# Patient Record
Sex: Male | Born: 1940
Health system: Southern US, Community
[De-identification: ages and names within clinical notes are randomized; demographics above are authoritative.]

## PROBLEM LIST (undated history)

## (undated) DIAGNOSIS — I2510 Atherosclerotic heart disease of native coronary artery without angina pectoris: Secondary | ICD-10-CM

## (undated) DIAGNOSIS — I10 Essential (primary) hypertension: Secondary | ICD-10-CM

## (undated) HISTORY — PX: HERNIA REPAIR: SHX51

## (undated) HISTORY — PX: CARDIAC SURGERY: SHX584

---

## 2011-12-09 ENCOUNTER — Emergency Department: Payer: Self-pay | Admitting: Emergency Medicine

## 2017-02-03 ENCOUNTER — Ambulatory Visit (INDEPENDENT_AMBULATORY_CARE_PROVIDER_SITE_OTHER): Payer: Medicare Other

## 2017-02-03 ENCOUNTER — Ambulatory Visit
Admission: EM | Admit: 2017-02-03 | Discharge: 2017-02-03 | Disposition: A | Discharge status: Home / Self Care | Payer: Medicare Other | Attending: Family Medicine | Admitting: Family Medicine

## 2017-02-03 ENCOUNTER — Encounter: Payer: Self-pay | Admitting: *Deleted

## 2017-02-03 DIAGNOSIS — S51812A Laceration without foreign body of left forearm, initial encounter: Secondary | ICD-10-CM | POA: Diagnosis not present

## 2017-02-03 DIAGNOSIS — S40022A Contusion of left upper arm, initial encounter: Secondary | ICD-10-CM

## 2017-02-03 HISTORY — DX: Essential (primary) hypertension: I10

## 2017-02-03 HISTORY — DX: Atherosclerotic heart disease of native coronary artery without angina pectoris: I25.10

## 2017-02-03 MED ORDER — TETANUS-DIPHTH-ACELL PERTUSSIS 5-2.5-18.5 LF-MCG/0.5 IM SUSP
0.5000 mL | Freq: Once | INTRAMUSCULAR | Status: AC
  Administered 2017-02-03: 0.5 mL via INTRAMUSCULAR

## 2017-02-03 MED ORDER — MUPIROCIN 2 % EX OINT: TOPICAL_OINTMENT | CUTANEOUS | 0 refills | Status: DC

## 2017-02-03 NOTE — Discharge Instructions (Signed)
Use medication as prescribed. Keep clean. Clean daily with soap and water.   Follow up with your primary care physician this week as needed. Return to Urgent care for new or worsening concerns.
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
## 2017-02-11 ENCOUNTER — Ambulatory Visit
Admission: EM | Admit: 2017-02-11 | Discharge: 2017-02-11 | Disposition: A | Discharge status: Home / Self Care | Payer: Medicare Other | Attending: Emergency Medicine | Admitting: Emergency Medicine

## 2017-02-11 DIAGNOSIS — S5012XA Contusion of left forearm, initial encounter: Secondary | ICD-10-CM
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
## 2017-02-11 NOTE — Discharge Instructions (Signed)
Contusion medication as prescribed. Keep clean. Elevate. Warm compresses off and on as discussed.   Follow up with your primary care physician this week as needed. Return to Urgent care for new or worsening concerns.
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
## 2018-06-14 ENCOUNTER — Other Ambulatory Visit: Payer: Self-pay

## 2018-06-14 ENCOUNTER — Encounter: Payer: Self-pay | Admitting: Emergency Medicine

## 2018-06-14 ENCOUNTER — Ambulatory Visit
Admission: EM | Admit: 2018-06-14 | Discharge: 2018-06-14 | Disposition: A | Discharge status: Home / Self Care | Payer: Medicare Other | Attending: Family Medicine | Admitting: Family Medicine

## 2018-06-14 DIAGNOSIS — R42 Dizziness and giddiness: Secondary | ICD-10-CM

## 2018-06-14 DIAGNOSIS — H6983 Other specified disorders of Eustachian tube, bilateral: Secondary | ICD-10-CM | POA: Diagnosis not present

## 2018-06-14 DIAGNOSIS — I10 Essential (primary) hypertension: Secondary | ICD-10-CM | POA: Diagnosis not present

## 2018-06-14 DIAGNOSIS — H9192 Unspecified hearing loss, left ear: Secondary | ICD-10-CM | POA: Diagnosis not present

## 2018-06-14 MED ORDER — CETIRIZINE HCL 10 MG PO TABS: 10.0000 mg | ORAL_TABLET | Freq: Every day | ORAL | 0 refills | Status: DC

## 2018-06-14 MED ORDER — FLUTICASONE PROPIONATE 50 MCG/ACT NA SUSP: 2.0000 | Freq: Every day | NASAL | 0 refills | Status: DC

## 2018-06-14 NOTE — Discharge Instructions (Addendum)
Use the Flonase 2 sprays once daily for the next 2 to 4 weeks.  Use the Zyrtec (cetirizine) 1 each day for 30 days.  If you are not improving follow-up with ear nose and throat.  It was a pleasure taking care of you today.  Thank you for your service

## 2018-06-14 NOTE — ED Provider Notes (Signed)
[facility]    CSN: [id number] Arrival date & time: 06/14/18  1256     History   Chief Complaint Chief Complaint  Patient presents with  . Ear Fullness    HPI Bobby BEAGLEY Sr. is a 77 y.o. male.   HPI  -year-old male presents with fullness in both of his ears.  He states that it is more noticeable on the left.  No pain.  He states that hearing is decreased somewhat.  He also has some occasional dizziness.  Very similar episode years ago that he thinks he was given eardrops and improved.  Not been swimming or been at altitude recently.  Not had any recent colds but has noticed when there is a change in weather he will have allergy type symptoms.  Nuys any fever or chills.  He has had no drainage or tinnitus.           Past Medical History:  Diagnosis Date  . Coronary artery disease   . Hypertension     There are no active problems to display for this patient.   Past Surgical History:  Procedure Laterality Date  . CARDIAC SURGERY         Home Medications    Prior to Admission medications   Medication Sig Start Date End Date Taking? Authorizing Provider  aspirin 81 MG chewable tablet Chew by mouth daily.   Yes [provider]  carvedilol (COREG) 12.5 MG tablet Take 12.5 mg by mouth 2 (two) times daily with a meal.   Yes [provider]  Cholecalciferol (VITAMIN D3 PO) Take by mouth.   Yes [provider]  Cyanocobalamin (VITAMIN B 12 PO) Take by mouth.   Yes [provider]  finasteride (PROSCAR) 5 MG tablet Take 5 mg by mouth daily.   Yes [provider]  latanoprost (XALATAN) 0.005 % ophthalmic solution Place 1 drop into both eyes at bedtime.   Yes [provider]  mupirocin ointment (BACTROBAN) 2 % Apply three times a day for 5 days. 02/03/17  Yes Renford Dills, NP  cetirizine (ZYRTEC ALLERGY) 10 MG tablet Take 1 tablet (10 mg total) by mouth daily. 06/14/18   Lutricia Feil, PA-C    fluticasone (FLONASE) 50 MCG/ACT nasal spray Place 2 sprays into both nostrils daily. 06/14/18   Lutricia Feil, PA-C    Family History History reviewed. No pertinent family history.  Social History Social History   Tobacco Use  . Smoking status: Former Games developer  . Smokeless tobacco: Never Used  Substance Use Topics  . Alcohol use: No  . Drug use: No     Allergies   Patient has no known allergies.   Review of Systems Review of Systems  Constitutional: Negative for activity change, appetite change, chills, fatigue and fever.  HENT: Positive for hearing loss. Negative for ear discharge and ear pain.      Physical Exam Triage Vital Signs ED Triage Vitals  Enc Vitals Group     BP 06/14/18 1315 (!) 164/73     Pulse Rate 06/14/18 1315 63     Resp 06/14/18 1315 18     Temp 06/14/18 1315 97.8 F (36.6 C)     Temp Source 06/14/18 1315 Oral     SpO2 06/14/18 1315 100 %     Weight 06/14/18 1317 170 lb (77.1 kg)     Height 06/14/18 1317 5\' 8"  (1.727 m)     Head Circumference --  Peak Flow --      Pain Score 06/14/18 1315 0     Pain Loc --      Pain Edu? --      Excl. in GC? --    No data found.  Updated Vital Signs BP (!) 164/73 (BP Location: Left Arm)   Pulse 63   Temp 97.8 F (36.6 C) (Oral)   Resp 18   Ht 5\' 8"  (1.727 m)   Wt 170 lb (77.1 kg)   SpO2 100%   BMI 25.85 kg/m   Visual Acuity Right Eye Distance:   Left Eye Distance:   Bilateral Distance:    Right Eye Near:   Left Eye Near:    Bilateral Near:     Physical Exam  Constitutional: He is oriented to person, place, and time. He appears well-developed and well-nourished. No distress.  HENT:  Head: Normocephalic.  Right Ear: External ear normal.  Left Ear: External ear normal.  Nose: Nose normal.  Mouth/Throat: Oropharynx is clear and moist. No oropharyngeal exudate.  Examination left ear shows a normal light reflex.  There is mild sclerosis of the posterior aspect of the TM.  The right  TM does more sclerosis loss of the light reflex no erythema.No  Air-fluid level is appreciated.  Eyes: Pupils are equal, round, and reactive to light. Right eye exhibits no discharge. Left eye exhibits no discharge.  Neck: Normal range of motion.  Pulmonary/Chest: Effort normal and breath sounds normal.  Musculoskeletal: Normal range of motion.  Neurological: He is alert and oriented to person, place, and time.  Skin: Skin is warm and dry. He is not diaphoretic.  Psychiatric: He has a normal mood and affect. His behavior is normal. Judgment and thought content normal.  Nursing note and vitals reviewed.    UC Treatments / Results  Labs (all labs ordered are listed, but only abnormal results are displayed) Labs Reviewed - No data to display  EKG None  Radiology No results found.  Procedures Procedures (including critical care time)  Medications Ordered in UC Medications - No data to display  Initial Impression / Assessment and Plan / UC Course  I have reviewed the triage vital signs and the nursing notes.  Pertinent labs & imaging results that were available during my care of the patient were reviewed by me and considered in my medical decision making (see chart for details).    Told patient that it is likely that he has eustachian tube dysfunction accounting for his ear fullness.  May also be responsible for contributing to his dizziness although dehydration may figure as a part.  Start him on Flonase daily for 3 to 4 weeks.  In addition I have asked him to start taking Zyrtec on a daily basis.  Provided him the name of an ear nose and throat specialist that if he is not improving he would follow-up with them. Final Clinical Impressions(s) / UC Diagnoses   Final diagnoses:  Eustachian tube dysfunction, bilateral     Discharge Instructions     Use the Flonase 2 sprays once daily for the next 2 to 4 weeks.  Use the Zyrtec (cetirizine) 1 each day for 30 days.  If you are not  improving follow-up with ear nose and throat.  It was a pleasure taking care of you today.  Thank you for your service   ED Prescriptions    Medication Sig Dispense Auth. Provider   fluticasone (FLONASE) 50 MCG/ACT nasal spray Place 2 sprays  into both nostrils daily. 16 g Ovid Curd P, PA-C   cetirizine (ZYRTEC ALLERGY) 10 MG tablet Take 1 tablet (10 mg total) by mouth daily. 30 tablet Lutricia Feil, PA-C     Controlled Substance Prescriptions [place] Controlled Substance Registry consulted? Not Applicable   Lutricia Feil, PA-C 06/14/18 1556

## 2018-06-14 NOTE — ED Triage Notes (Signed)
Patient c/o ear fullness in both ears but states the left ear is worse. Patient denies pain.

## 2019-09-06 ENCOUNTER — Ambulatory Visit: Payer: Medicare Other | Attending: Internal Medicine

## 2019-09-06 DIAGNOSIS — Z20822 Contact with and (suspected) exposure to covid-19: Secondary | ICD-10-CM

## 2019-09-07 ENCOUNTER — Telehealth: Payer: Self-pay

## 2019-09-07 LAB — NOVEL CORONAVIRUS, NAA: SARS-CoV-2, NAA: NOT DETECTED

## 2019-09-07 NOTE — Telephone Encounter (Signed)
Patient called in requesting COVID19 lab results  - DOB/Address verified - Negative results given, no further questions. 

## 2019-09-16 ENCOUNTER — Other Ambulatory Visit: Payer: Self-pay

## 2019-09-16 DIAGNOSIS — R0902 Hypoxemia: Secondary | ICD-10-CM | POA: Diagnosis not present

## 2019-09-16 DIAGNOSIS — I160 Hypertensive urgency: Secondary | ICD-10-CM | POA: Diagnosis present

## 2019-09-16 DIAGNOSIS — Z79899 Other long term (current) drug therapy: Secondary | ICD-10-CM

## 2019-09-16 DIAGNOSIS — Z7982 Long term (current) use of aspirin: Secondary | ICD-10-CM

## 2019-09-16 DIAGNOSIS — Z87891 Personal history of nicotine dependence: Secondary | ICD-10-CM

## 2019-09-16 DIAGNOSIS — J9601 Acute respiratory failure with hypoxia: Secondary | ICD-10-CM | POA: Diagnosis present

## 2019-09-16 DIAGNOSIS — U071 COVID-19: Principal | ICD-10-CM | POA: Diagnosis present

## 2019-09-16 DIAGNOSIS — I10 Essential (primary) hypertension: Secondary | ICD-10-CM | POA: Diagnosis present

## 2019-09-16 DIAGNOSIS — I2510 Atherosclerotic heart disease of native coronary artery without angina pectoris: Secondary | ICD-10-CM | POA: Diagnosis present

## 2019-09-16 DIAGNOSIS — J1282 Pneumonia due to coronavirus disease 2019: Secondary | ICD-10-CM | POA: Diagnosis present

## 2019-09-16 DIAGNOSIS — E871 Hypo-osmolality and hyponatremia: Secondary | ICD-10-CM | POA: Diagnosis present

## 2019-09-16 LAB — LIPASE, BLOOD: Lipase: 60 U/L — ABNORMAL HIGH (ref 11–51)

## 2019-09-16 LAB — COMPREHENSIVE METABOLIC PANEL
ALT: 22 U/L (ref 0–44)
AST: 40 U/L (ref 15–41)
Albumin: 3.7 g/dL (ref 3.5–5.0)
Alkaline Phosphatase: 67 U/L (ref 38–126)
Anion gap: 12 (ref 5–15)
BUN: 18 mg/dL (ref 8–23)
CO2: 23 mmol/L (ref 22–32)
Calcium: 8.4 mg/dL — ABNORMAL LOW (ref 8.9–10.3)
Chloride: 89 mmol/L — ABNORMAL LOW (ref 98–111)
Creatinine, Ser: 1.02 mg/dL (ref 0.61–1.24)
GFR calc Af Amer: >60 mL/min (ref >60)
GFR calc non Af Amer: >60 mL/min (ref >60)
Glucose, Bld: 107 mg/dL — ABNORMAL HIGH (ref 70–99)
Potassium: 3.9 mmol/L (ref 3.5–5.1)
Sodium: 124 mmol/L — ABNORMAL LOW (ref 135–145)
Total Bilirubin: 1 mg/dL (ref 0.3–1.2)
Total Protein: 7.6 g/dL (ref 6.5–8.1)

## 2019-09-16 LAB — CBC WITH DIFFERENTIAL/PLATELET
Abs Immature Granulocytes: 0.03 10*3/uL (ref 0.00–0.07)
Basophils Absolute: 0 10*3/uL (ref 0.0–0.1)
Basophils Relative: 0 %
Eosinophils Absolute: 0 10*3/uL (ref 0.0–0.5)
Eosinophils Relative: 0 %
HCT: 39.6 % (ref 39.0–52.0)
Hemoglobin: 14.1 g/dL (ref 13.0–17.0)
Immature Granulocytes: 1 %
Lymphocytes Relative: 11 %
Lymphs Abs: 0.7 10*3/uL (ref 0.7–4.0)
MCH: 31.5 pg (ref 26.0–34.0)
MCHC: 35.6 g/dL (ref 30.0–36.0)
MCV: 88.4 fL (ref 80.0–100.0)
Monocytes Absolute: 0.3 10*3/uL (ref 0.1–1.0)
Monocytes Relative: 5 %
Neutro Abs: 5.2 10*3/uL (ref 1.7–7.7)
Neutrophils Relative %: 83 %
Platelets: 217 10*3/uL (ref 150–400)
RBC: 4.48 MIL/uL (ref 4.22–5.81)
RDW: 13 % (ref 11.5–15.5)
WBC: 6.2 10*3/uL (ref 4.0–10.5)
nRBC: 0 % (ref 0.0–0.2)

## 2019-09-16 NOTE — ED Notes (Signed)
Patient to triage via wheelchair by EMS.  EMS reports nausea for 1 week.  Wife is COVID + and recently released from hospital. EMS vitals  BP 190/100,  HR 78, pulse oxi 97% on roomair, temp 97.8  (oral), 12 lead within normal limits.

## 2019-09-16 NOTE — ED Triage Notes (Signed)
Patient to ED via EMS for nausea x 1 week.  Patient reports wife recently released from hospital related to COVID.

## 2019-09-16 NOTE — ED Notes (Signed)
Please call daughter Nicoletta Ba with updates [phone].

## 2019-09-17 ENCOUNTER — Inpatient Hospital Stay
Admission: EM | Admit: 2019-09-17 | Discharge: 2019-09-21 | DRG: 177 | Disposition: A | Discharge status: Home Health | Payer: Medicare Other | Attending: Internal Medicine | Admitting: Internal Medicine

## 2019-09-17 ENCOUNTER — Emergency Department: Payer: Medicare Other

## 2019-09-17 ENCOUNTER — Encounter: Payer: Self-pay | Admitting: Internal Medicine

## 2019-09-17 DIAGNOSIS — I10 Essential (primary) hypertension: Secondary | ICD-10-CM | POA: Diagnosis present

## 2019-09-17 DIAGNOSIS — U071 COVID-19: Principal | ICD-10-CM

## 2019-09-17 DIAGNOSIS — I2510 Atherosclerotic heart disease of native coronary artery without angina pectoris: Secondary | ICD-10-CM | POA: Diagnosis present

## 2019-09-17 DIAGNOSIS — Z7982 Long term (current) use of aspirin: Secondary | ICD-10-CM | POA: Diagnosis not present

## 2019-09-17 DIAGNOSIS — E871 Hypo-osmolality and hyponatremia: Secondary | ICD-10-CM | POA: Diagnosis present

## 2019-09-17 DIAGNOSIS — J9601 Acute respiratory failure with hypoxia: Secondary | ICD-10-CM | POA: Diagnosis present

## 2019-09-17 DIAGNOSIS — Z87891 Personal history of nicotine dependence: Secondary | ICD-10-CM | POA: Diagnosis not present

## 2019-09-17 DIAGNOSIS — J1282 Pneumonia due to coronavirus disease 2019: Secondary | ICD-10-CM

## 2019-09-17 DIAGNOSIS — R0902 Hypoxemia: Secondary | ICD-10-CM | POA: Diagnosis present

## 2019-09-17 DIAGNOSIS — Z79899 Other long term (current) drug therapy: Secondary | ICD-10-CM | POA: Diagnosis not present

## 2019-09-17 DIAGNOSIS — I160 Hypertensive urgency: Secondary | ICD-10-CM | POA: Diagnosis present

## 2019-09-17 LAB — RESPIRATORY PANEL BY RT PCR (FLU A&B, COVID)
Influenza A by PCR: NEGATIVE
Influenza B by PCR: NEGATIVE
SARS Coronavirus 2 by RT PCR: POSITIVE — AB

## 2019-09-17 LAB — POC SARS CORONAVIRUS 2 AG: SARS Coronavirus 2 Ag: POSITIVE — AB

## 2019-09-17 MED ORDER — ONDANSETRON HCL 4 MG PO TABS: 4.0000 mg | ORAL_TABLET | Freq: Four times a day (QID) | ORAL | Status: DC | PRN

## 2019-09-17 MED ORDER — VITAMIN D 25 MCG (1000 UNIT) PO TABS
1000.0000 [IU] | ORAL_TABLET | Freq: Every day | ORAL | Status: DC
  Administered 2019-09-17 – 2019-09-21 (×5): 1000 [IU] via ORAL
  Filled 2019-09-17 (×5): qty 1

## 2019-09-17 MED ORDER — ONDANSETRON HCL 4 MG/2ML IJ SOLN
4.0000 mg | Freq: Four times a day (QID) | INTRAMUSCULAR | Status: DC | PRN
  Administered 2019-09-17: 4 mg via INTRAVENOUS
  Filled 2019-09-17: qty 2

## 2019-09-17 MED ORDER — ONDANSETRON HCL 4 MG/2ML IJ SOLN
INTRAMUSCULAR | Status: AC
  Filled 2019-09-17: qty 2

## 2019-09-17 MED ORDER — LATANOPROST 0.005 % OP SOLN
1.0000 [drp] | Freq: Every day | OPHTHALMIC | Status: DC
  Administered 2019-09-17 – 2019-09-20 (×4): 1 [drp] via OPHTHALMIC
  Filled 2019-09-17 (×2): qty 2.5

## 2019-09-17 MED ORDER — DEXAMETHASONE SODIUM PHOSPHATE 10 MG/ML IJ SOLN
10.0000 mg | Freq: Once | INTRAMUSCULAR | Status: AC
  Administered 2019-09-17: 05:00:00 10 mg via INTRAVENOUS
  Filled 2019-09-17: qty 1

## 2019-09-17 MED ORDER — CARVEDILOL 3.125 MG PO TABS
6.2500 mg | ORAL_TABLET | Freq: Two times a day (BID) | ORAL | Status: DC
  Administered 2019-09-17 – 2019-09-21 (×9): 6.25 mg via ORAL
  Filled 2019-09-17 (×6): qty 2
  Filled 2019-09-17: qty 1
  Filled 2019-09-17: qty 2
  Filled 2019-09-17: qty 1

## 2019-09-17 MED ORDER — SODIUM CHLORIDE 0.9 % IV SOLN
100.0000 mg | Freq: Every day | INTRAVENOUS | Status: AC
  Administered 2019-09-18 – 2019-09-21 (×4): 100 mg via INTRAVENOUS
  Filled 2019-09-17 (×5): qty 20

## 2019-09-17 MED ORDER — POLYETHYLENE GLYCOL 3350 17 G PO PACK: 17.0000 g | PACK | Freq: Every day | ORAL | Status: DC | PRN

## 2019-09-17 MED ORDER — SODIUM CHLORIDE 0.9 % IV BOLUS
1000.0000 mL | Freq: Once | INTRAVENOUS | Status: AC
  Administered 2019-09-17: 04:00:00 1000 mL via INTRAVENOUS

## 2019-09-17 MED ORDER — ENOXAPARIN SODIUM 40 MG/0.4ML ~~LOC~~ SOLN
40.0000 mg | SUBCUTANEOUS | Status: DC
  Administered 2019-09-17 – 2019-09-20 (×4): 40 mg via SUBCUTANEOUS
  Filled 2019-09-17 (×4): qty 0.4

## 2019-09-17 MED ORDER — SODIUM CHLORIDE 0.9 % IV SOLN
200.0000 mg | Freq: Once | INTRAVENOUS | Status: AC
  Administered 2019-09-17: 200 mg via INTRAVENOUS
  Filled 2019-09-17: qty 200

## 2019-09-17 MED ORDER — DEXAMETHASONE SODIUM PHOSPHATE 10 MG/ML IJ SOLN
6.0000 mg | INTRAMUSCULAR | Status: DC
  Administered 2019-09-18 – 2019-09-21 (×4): 6 mg via INTRAVENOUS
  Filled 2019-09-17 (×4): qty 1

## 2019-09-17 MED ORDER — VITAMIN B-12 1000 MCG PO TABS
1000.0000 ug | ORAL_TABLET | Freq: Every day | ORAL | Status: DC
  Administered 2019-09-17 – 2019-09-21 (×5): 1000 ug via ORAL
  Filled 2019-09-17 (×5): qty 1

## 2019-09-17 MED ORDER — SODIUM CHLORIDE 0.9 % IV SOLN: INTRAVENOUS | Status: DC

## 2019-09-17 MED ORDER — ASPIRIN 81 MG PO CHEW
81.0000 mg | CHEWABLE_TABLET | Freq: Every day | ORAL | Status: DC
  Administered 2019-09-17 – 2019-09-21 (×5): 81 mg via ORAL
  Filled 2019-09-17 (×5): qty 1

## 2019-09-17 MED ORDER — ACETAMINOPHEN 325 MG PO TABS
650.0000 mg | ORAL_TABLET | Freq: Four times a day (QID) | ORAL | Status: DC | PRN
  Administered 2019-09-18 (×2): 650 mg via ORAL
  Filled 2019-09-17 (×2): qty 2

## 2019-09-17 NOTE — ED Notes (Signed)
Patient notified that his daughter had called to check on him. Patient also given an update on wait time. Patient verbalizes understanding.

## 2019-09-17 NOTE — ED Notes (Signed)
Patient to stat desk in no acute distress asking about wait time. Patient given update on wait time. Patient verbalizes understanding.  

## 2019-09-17 NOTE — ED Notes (Signed)
Pt ambulatory to toilet

## 2019-09-17 NOTE — ED Provider Notes (Signed)
[facility] Emergency Department Provider Note  ____________________________________________   First MD Initiated Contact with Patient 09/17/19 [phone]     (approximate)  I have reviewed the triage vital signs and the nursing notes.   HISTORY  Chief Complaint Nausea   HPI Bobby TANZI Sr. is a 79 y.o. male   presents to the emergency department secondary to nausea and diarrhea x1 week.  Patient also admits to subjective fevers oral temperature on arrival 100.2.  Patient also admits to cough and dyspnea.  Of note patient's wife was recently discharged from the hospital secondary to COVID-19.  Patient admits to generalized weakness and fatigue.     Past Medical History:  Diagnosis Date  . Coronary artery disease   . Hypertension     There are no problems to display for this patient.   Past Surgical History:  Procedure Laterality Date  . CARDIAC SURGERY      Prior to Admission medications   Medication Sig Start Date End Date Taking? Authorizing Provider  aspirin 81 MG chewable tablet Chew 81 mg by mouth daily.     [provider]  carvedilol (COREG) 6.25 MG tablet Take 6.25 mg by mouth 2 (two) times daily with a meal.     [provider]  Cholecalciferol (VITAMIN D3 PO) Take 1 tablet by mouth daily.     [provider]  latanoprost (XALATAN) 0.005 % ophthalmic solution Place 1 drop into both eyes at bedtime.    [provider]  predniSONE (DELTASONE) 20 MG tablet Take 40 mg by mouth daily. 09/11/19 09/18/19  [provider]  vitamin B-12 (CYANOCOBALAMIN) 1000 MCG tablet Take 1,000 mcg by mouth daily.    [provider]    Allergies Patient has no known allergies.  No family history on file.  Social History Social History   Tobacco Use  . Smoking status: Former Games developer  . Smokeless tobacco: Never Used  Substance Use Topics  . Alcohol use: No  . Drug use: No    Review of  Systems Constitutional: Positive for fever/chills Eyes: No visual changes. ENT: No sore throat. Cardiovascular: Denies chest pain. Respiratory: Positive for cough and dyspnea Gastrointestinal: No abdominal pain.  No nausea, no vomiting.  Positive for diarrhea.  No constipation. Genitourinary: Negative for dysuria. Musculoskeletal: Negative for neck pain.  Negative for back pain. Integumentary: Negative for rash. Neurological: Negative for headaches, focal weakness or numbness.   ____________________________________________   PHYSICAL EXAM:  VITAL SIGNS: ED Triage Vitals  Enc Vitals Group     BP 09/16/19 2210 (!) 223/84     Pulse Rate 09/16/19 2210 77     Resp 09/16/19 2210 18     Temp 09/16/19 2210 100.2 F (37.9 C)     Temp Source 09/16/19 2210 Oral     SpO2 09/16/19 2210 95 %     Weight 09/16/19 2211 77.1 kg (170 lb)     Height 09/16/19 2211 1.727 m (5\' 8" )     Head Circumference --      Peak Flow --      Pain Score 09/16/19 2211 0     Pain Loc --      Pain Edu? --      Excl. in GC? --     Constitutional: Alert and oriented.  Apparent dyspnea Eyes: Conjunctivae are normal.  Mouth/Throat: Patient is wearing a mask. Neck: No stridor.  No meningeal signs.   Cardiovascular: Normal rate, regular rhythm. Good peripheral  circulation. Grossly normal heart sounds. Respiratory: Tachypnea, diffuse rhonchi on auscultation.. Gastrointestinal: Soft and nontender. No distention.  Musculoskeletal: No lower extremity tenderness nor edema. No gross deformities of extremities. Neurologic:  Normal speech and language. No gross focal neurologic deficits are appreciated.  Skin:  Skin is warm, dry and intact. Psychiatric: Mood and affect are normal. Speech and behavior are normal.  ____________________________________________   LABS (all labs ordered are listed, but only abnormal results are displayed)  Labs Reviewed  RESPIRATORY PANEL BY RT PCR (FLU A&B, COVID) - Abnormal; Notable  for the following components:      Result Value   SARS Coronavirus 2 by RT PCR POSITIVE (*)    All other components within normal limits  COMPREHENSIVE METABOLIC PANEL - Abnormal; Notable for the following components:   Sodium 124 (*)    Chloride 89 (*)    Glucose, Bld 107 (*)    Calcium 8.4 (*)    All other components within normal limits  LIPASE, BLOOD - Abnormal; Notable for the following components:   Lipase 60 (*)    All other components within normal limits  POC SARS CORONAVIRUS 2 AG - Abnormal; Notable for the following components:   SARS Coronavirus 2 Ag POSITIVE (*)    All other components within normal limits  CBC WITH DIFFERENTIAL/PLATELET  POC SARS CORONAVIRUS 2 AG -  ED   ____  RADIOLOGY I, Cecil N Kenesha Moshier, personally viewed and evaluated these images (plain radiographs) as part of my medical decision making, as well as reviewing the written report by the radiologist.  ED MD interpretation: Bilateral opacities noted on chest x-ray  Official radiology report(s): DG Chest Portable 1 View  Result Date: 09/17/2019 CLINICAL DATA:  Cough EXAM: PORTABLE CHEST 1 VIEW COMPARISON:  None. FINDINGS: Mild cardiomegaly with calcific aortic atherosclerosis. Remote median sternotomy. Bilateral basilar predominant head air central opacities. No pleural effusion or pneumothorax. IMPRESSION: Bilateral basilar predominant air central opacities, which may indicate pulmonary edema or infection. Electronically Signed   By: Ulyses Jarred M.D.   On: 09/17/2019 04:59    ____________________________________________   PROCEDURES     .Critical Care Performed by: Gregor Hams, MD Authorized by: Gregor Hams, MD   Critical care provider statement:    Critical care time (minutes):  30   Critical care time was exclusive of:  Separately billable procedures and treating other patients   Critical care was necessary to treat or prevent imminent or life-threatening deterioration of  the following conditions:  Respiratory failure and endocrine crisis   Critical care was time spent personally by me on the following activities:  Development of treatment plan with patient or surrogate, discussions with consultants, evaluation of patient's response to treatment, examination of patient, obtaining history from patient or surrogate, ordering and performing treatments and interventions, ordering and review of laboratory studies, ordering and review of radiographic studies, pulse oximetry, re-evaluation of patient's condition and review of old charts     ____________________________________________   INITIAL IMPRESSION / MDM / [place] / ED COURSE  As part of my medical decision making, I reviewed the following data within the electronic MEDICAL RECORD NUMBER   79 year old male presenting with above-stated history and physical exam concerning for COVID-19 infection which was confirmed in the emergency department.  Patient also noted to be hyponatremic and as such 2 L IV normal saline administered.  Patient's oxygen saturation on room air 85% during my evaluation.  As such 3 L oxygen via nasal  cannula was administered.  Patient discussed with Dr. Para March for hospital admission for further evaluation and management.  ____________________________________________  FINAL CLINICAL IMPRESSION(S) / ED DIAGNOSES  Final diagnoses:  Hyponatremia  COVID-19 virus infection  Pneumonia due to COVID-19 virus     MEDICATIONS GIVEN DURING THIS VISIT:  Medications  remdesivir 200 mg in sodium chloride 0.9% 250 mL IVPB (200 mg Intravenous New Bag/Given 09/17/19 0443)    Followed by  remdesivir 100 mg in sodium chloride 0.9 % 100 mL IVPB (has no administration in time range)  sodium chloride 0.9 % bolus 1,000 mL (1,000 mLs Intravenous New Bag/Given 09/17/19 0346)  ondansetron (ZOFRAN) 4 MG/2ML injection (  Given 09/17/19 0420)  dexamethasone (DECADRON) injection 10 mg (10 mg Intravenous  Given 09/17/19 0442)     ED Discharge Orders    None      *Please note:  Bobby BUSSEY Sr. was evaluated in Emergency Department on 09/17/2019 for the symptoms described in the history of present illness. He was evaluated in the context of the global COVID-19 pandemic, which necessitated consideration that the patient might be at risk for infection with the SARS-CoV-2 virus that causes COVID-19. Institutional protocols and algorithms that pertain to the evaluation of patients at risk for COVID-19 are in a state of rapid change based on information released by regulatory bodies including the CDC and federal and state organizations. These policies and algorithms were followed during the patient's care in the ED.  Some ED evaluations and interventions may be delayed as a result of limited staffing during the pandemic.*  Note:  This document was prepared using Dragon voice recognition software and may include unintentional dictation errors.   Darci Current, MD 09/17/19 [phone]

## 2019-09-17 NOTE — ED Notes (Signed)
Lunch tray provided to pt. Pt sitting up at this time.

## 2019-09-17 NOTE — ED Notes (Signed)
Pt sats 88%, placedx on 3L at this time

## 2019-09-17 NOTE — ED Notes (Signed)
Pt ambulatory to toilet to urinate.  

## 2019-09-17 NOTE — ED Notes (Addendum)
ED TO INPATIENT HANDOFF REPORT  ED Nurse Name and Phone #:  Elijah Birk RN   [phone]  S Name/Age/Gender Bobby Church Sr. 79 y.o. male Room/Bed: ED10A/ED10A  Code Status   Code Status: Full Code  Home/SNF/Other Home Patient oriented to: self, place, time and situation Is this baseline? Yes   Triage Complete: Triage complete  Chief Complaint Pneumonia due to COVID-19 virus [U07.1, J12.82]  Triage Note Patient to ED via EMS for nausea x 1 week.  Patient reports wife recently released from hospital related to COVID.    Allergies No Known Allergies  Level of Care/Admitting Diagnosis ED Disposition    ED Disposition Condition Comment   Admit  Hospital Area: [facility] [[id number]]  Level of Care: Med-Surg [16]  Covid Evaluation: Confirmed COVID Positive  Diagnosis: Pneumonia due to COVID-19 virus [[id number]]  Admitting Physician: Conard Novak  Attending Physician: Conard Novak  Estimated length of stay: 3 - 4 days  Certification:: I certify this patient will need inpatient services for at least 2 midnights       B Medical/Surgery History Past Medical History:  Diagnosis Date  . Coronary artery disease   . Hypertension    Past Surgical History:  Procedure Laterality Date  . CARDIAC SURGERY       A IV Location/Drains/Wounds Patient Lines/Drains/Airways Status   Active Line/Drains/Airways    Name:   Placement date:   Placement time:   Site:   Days:   Peripheral IV 09/17/19 Left Antecubital   09/17/19    0336    Antecubital   less than 1          Intake/Output Last 24 hours  Intake/Output Summary (Last 24 hours) at 09/17/2019 2027 Last data filed at 09/17/2019 0754 Gross per 24 hour  Intake 1250 ml  Output --  Net 1250 ml    Labs/Imaging Results for orders placed or performed during the hospital encounter of 09/17/19 (from the past 48 hour(s))  CBC with Differential     Status: None   Collection Time:  09/16/19 10:19 PM  Result Value Ref Range   WBC 6.2 4.0 - 10.5 K/uL   RBC 4.48 4.22 - 5.81 MIL/uL   Hemoglobin 14.1 13.0 - 17.0 g/dL   HCT 40.0 86.7 - 61.9 %   MCV 88.4 80.0 - 100.0 fL   MCH 31.5 26.0 - 34.0 pg   MCHC 35.6 30.0 - 36.0 g/dL   RDW 50.9 32.6 - 71.2 %   Platelets 217 150 - 400 K/uL   nRBC 0.0 0.0 - 0.2 %   Neutrophils Relative % 83 %   Neutro Abs 5.2 1.7 - 7.7 K/uL   Lymphocytes Relative 11 %   Lymphs Abs 0.7 0.7 - 4.0 K/uL   Monocytes Relative 5 %   Monocytes Absolute 0.3 0.1 - 1.0 K/uL   Eosinophils Relative 0 %   Eosinophils Absolute 0.0 0.0 - 0.5 K/uL   Basophils Relative 0 %   Basophils Absolute 0.0 0.0 - 0.1 K/uL   Immature Granulocytes 1 %   Abs Immature Granulocytes 0.03 0.00 - 0.07 K/uL    Comment: Performed at [facility], [address] [place], [place] [address]  Comprehensive metabolic panel     Status: Abnormal   Collection Time: 09/16/19 10:19 PM  Result Value Ref Range   Sodium 124 (L) 135 - 145 mmol/L   Potassium 3.9 3.5 - 5.1 mmol/L   Chloride 89 (L) 98 - 111 mmol/L  CO2 23 22 - 32 mmol/L   Glucose, Bld 107 (H) 70 - 99 mg/dL   BUN 18 8 - 23 mg/dL   Creatinine, Ser 1.02 0.61 - 1.24 mg/dL   Calcium 8.4 (L) 8.9 - 10.3 mg/dL   Total Protein 7.6 6.5 - 8.1 g/dL   Albumin 3.7 3.5 - 5.0 g/dL   AST 40 15 - 41 U/L   ALT 22 0 - 44 U/L   Alkaline Phosphatase 67 38 - 126 U/L   Total Bilirubin 1.0 0.3 - 1.2 mg/dL   GFR calc non Af Amer >60 >60 mL/min   GFR calc Af Amer >60 >60 mL/min   Anion gap 12 5 - 15    Comment: Performed at [facility], [address] [place], [place] [address]  Lipase, blood     Status: Abnormal   Collection Time: 09/16/19 10:19 PM  Result Value Ref Range   Lipase 60 (H) 11 - 51 U/L    Comment: Performed at [facility], [address], [place], [place] [address]  Respiratory Panel by RT PCR (Flu A&B, Covid) - Nasopharyngeal Swab     Status: Abnormal   Collection Time: 09/17/19  3:46 AM    Specimen: Nasopharyngeal Swab  Result Value Ref Range   SARS Coronavirus 2 by RT PCR POSITIVE (A) NEGATIVE    Comment: RESULT CALLED TO, READ BACK BY AND VERIFIED WITH: Annabelle Harman [facility] 09/17/19 AT 0454 HS    Influenza A by PCR NEGATIVE NEGATIVE   Influenza B by PCR NEGATIVE NEGATIVE    Comment: (NOTE) The Xpert Xpress SARS-CoV-2/FLU/RSV assay is intended as an aid in  the diagnosis of influenza from Nasopharyngeal swab specimens and  should not be used as a sole basis for treatment. Nasal washings and  aspirates are unacceptable for Xpert Xpress SARS-CoV-2/FLU/RSV  testing. Fact Sheet for Patients: PinkCheek.be Fact Sheet for Healthcare Providers: GravelBags.it This test is not yet approved or cleared by the [place] FDA and  has been authorized for detection and/or diagnosis of SARS-CoV-2 by  FDA under an Emergency Use Authorization (EUA). This EUA will remain  in effect (meaning this test can be used) for the duration of the  Covid-19 declaration under Section 564(b)(1) of the Act, 21  U.S.C. section 360bbb-3(b)(1), unless the authorization is  terminated or revoked. Performed at [facility], [address] [place], [place] [address]   POC SARS Coronavirus 2 Ag     Status: Abnormal   Collection Time: 09/17/19  4:28 AM  Result Value Ref Range   SARS Coronavirus 2 Ag POSITIVE (A) NEGATIVE    Comment: (NOTE) SARS-CoV-2 antigen PRESENT. Positive results indicate the presence of viral antigens, but clinical correlation with patient history and other diagnostic information is necessary to determine patient infection status.  Positive results do not rule out bacterial infection or co-infection  with other viruses. False positive results are rare but can occur, and confirmatory RT-PCR testing may be appropriate in some circumstances. The expected result is Negative. Fact Sheet for Patients:  PodPark.tn Fact Sheet for Providers: GiftContent.is  This test is not yet approved or cleared by the [place] FDA and  has been authorized for detection and/or diagnosis of SARS-CoV-2 by FDA under an Emergency Use Authorization (EUA).  This EUA will remain in effect (meaning this test can be used) for the duration of  the COVID-19 declaration under Section 564(b)(1) of the Act, 21 U.S.C. section 360bbb-3(b)(1), unless the a uthorization is terminated or  revoked sooner.    DG Chest Portable 1 View  Result Date: 09/17/2019 CLINICAL DATA:  Cough EXAM: PORTABLE CHEST 1 VIEW COMPARISON:  None. FINDINGS: Mild cardiomegaly with calcific aortic atherosclerosis. Remote median sternotomy. Bilateral basilar predominant head air central opacities. No pleural effusion or pneumothorax. IMPRESSION: Bilateral basilar predominant air central opacities, which may indicate pulmonary edema or infection. Electronically Signed   By: Deatra Robinson M.D.   On: 09/17/2019 04:59    Pending Labs Unresulted Labs (From admission, onward)    Start     Ordered   09/18/19 0500  CBC with Differential/Platelet  Tomorrow morning,   STAT     09/17/19 0914   09/18/19 0500  Comprehensive metabolic panel  Tomorrow morning,   STAT     09/17/19 0914          Vitals/Pain Today's Vitals   09/17/19 1746 09/17/19 1830 09/17/19 1900 09/17/19 2020  BP:  (!) 150/70 (!) 144/70 (!) 156/82  Pulse:  62 (!) 59 68  Resp:  (!) 22 15 18   Temp:    98.3 F (36.8 C)  TempSrc:    Oral  SpO2:  96% 96% 96%  Weight:      Height:      PainSc: 0-No pain   0-No pain    Isolation Precautions Airborne and Contact precautions  Medications Medications  remdesivir 200 mg in sodium chloride 0.9% 250 mL IVPB (0 mg Intravenous Stopped 09/17/19 0601)    Followed by  remdesivir 100 mg in sodium chloride 0.9 % 100 mL IVPB (has no administration in time range)  dexamethasone  (DECADRON) injection 6 mg (has no administration in time range)  aspirin chewable tablet 81 mg (81 mg Oral Given 09/17/19 0951)  carvedilol (COREG) tablet 6.25 mg (6.25 mg Oral Given 09/17/19 1744)  vitamin B-12 (CYANOCOBALAMIN) tablet 1,000 mcg (1,000 mcg Oral Given 09/17/19 1135)  cholecalciferol (VITAMIN D3) tablet 1,000 Units (1,000 Units Oral Given 09/17/19 0951)  latanoprost (XALATAN) 0.005 % ophthalmic solution 1 drop (has no administration in time range)  enoxaparin (LOVENOX) injection 40 mg (has no administration in time range)  acetaminophen (TYLENOL) tablet 650 mg (has no administration in time range)  polyethylene glycol (MIRALAX / GLYCOLAX) packet 17 g (has no administration in time range)  ondansetron (ZOFRAN) tablet 4 mg ( Oral See Alternative 09/17/19 1111)    Or  ondansetron (ZOFRAN) injection 4 mg (4 mg Intravenous Given 09/17/19 1111)  0.9 %  sodium chloride infusion ( Intravenous New Bag/Given 09/17/19 0951)  sodium chloride 0.9 % bolus 1,000 mL (0 mLs Intravenous Stopped 09/17/19 0754)  dexamethasone (DECADRON) injection 10 mg (10 mg Intravenous Given 09/17/19 0442)    Mobility walks Low fall risk   Focused Assessments Cardiac Assessment Handoff:  Cardiac Rhythm: Normal sinus rhythm No results found for: CKTOTAL, CKMB, CKMBINDEX, TROPONINI No results found for: DDIMER Does the Patient currently have chest pain? No      R Recommendations: See Admitting Provider Note  Report given to:  Candace RN on 1C  Additional Notes:

## 2019-09-17 NOTE — ED Notes (Signed)
Supper tray placed at bedside. Pt states he has no appetite. Didn't eat breakfast or lunch. Informed pt to drink some juice or water. Pt given both at this time. Pt verbalized he would drink something.

## 2019-09-17 NOTE — H&P (Signed)
History and Physical:    Bobby Strong   ZJI:[id number] DOB: 05-09-1941 DOA: 09/17/2019  Referring MD/provider: Dr. Bayard Males [facility]CP: [facility]   Patient coming from: Home  Chief Complaint: Nausea and diarrhea  History of Present Illness:   Bobby YAFFE Sr. is an 79 y.o. male with medical history significant for hypertension, CAD, who was brought to the hospital because of nausea and diarrhea.  Patient is unable to provide adequate history because he is confused.  He apologized for not being able to tell me his story.  He admits to having nausea, diarrhea and cough.  He is unable to give details about her diarrhea but he said he is probably had it for about a week now.  He also has a cough and he said his breathing has not been great.  He feels very weak and tired.  He does not report any fever, chills, vomiting, abdominal pain or chest pain.  ED Course:  The patient had a temperature of 100.2, oxygen saturation of 85% on room air and BP of 223/84 in the emergency room.  He tested positive for coronavirus infection and sodium level was 124.  Chest x-ray showed bibasilar pneumonia.  He was given IV dexamethasone and remdesivir in the emergency room.  ROS:   ROS all other systems reviewed were negative  Past Medical History:   Past Medical History:  Diagnosis Date  . Coronary artery disease   . Hypertension     Past Surgical History:   Past Surgical History:  Procedure Laterality Date  . CARDIAC SURGERY      Social History:   Social History   Socioeconomic History  . Marital status: Married    Spouse name: Not on file  . Number of children: Not on file  . Years of education: Not on file  . Highest education level: Not on file  Occupational History  . Not on file  Tobacco Use  . Smoking status: Former Games developer  . Smokeless tobacco: Never Used  Substance and Sexual Activity  . Alcohol use: No  . Drug use: No  . Sexual activity: Not  on file  Other Topics Concern  . Not on file  Social History Narrative  . Not on file   Social Determinants of Health   Financial Resource Strain:   . Difficulty of Paying Living Expenses: Not on file  Food Insecurity:   . Worried About Programme researcher, broadcasting/film/video in the Last Year: Not on file  . Ran Out of Food in the Last Year: Not on file  Transportation Needs:   . Lack of Transportation (Medical): Not on file  . Lack of Transportation (Non-Medical): Not on file  Physical Activity:   . Days of Exercise per Week: Not on file  . Minutes of Exercise per Session: Not on file  Stress:   . Feeling of Stress : Not on file  Social Connections:   . Frequency of Communication with Friends and Family: Not on file  . Frequency of Social Gatherings with Friends and Family: Not on file  . Attends Religious Services: Not on file  . Active Member of Clubs or Organizations: Not on file  . Attends Banker Meetings: Not on file  . Marital Status: Not on file  Intimate Partner Violence:   . Fear of Current or Ex-Partner: Not on file  . Emotionally Abused: Not on file  . Physically Abused: Not on file  . Sexually  Abused: Not on file    Allergies   Patient has no known allergies.  Family history:   History reviewed. No pertinent family history.  Current Medications:   Prior to Admission medications   Medication Sig Start Date End Date Taking? Authorizing Provider  aspirin 81 MG chewable tablet Chew 81 mg by mouth daily.     [provider]  carvedilol (COREG) 6.25 MG tablet Take 6.25 mg by mouth 2 (two) times daily with a meal.     [provider]  Cholecalciferol (VITAMIN D3 PO) Take 1 tablet by mouth daily.     [provider]  latanoprost (XALATAN) 0.005 % ophthalmic solution Place 1 drop into both eyes at bedtime.    [provider]  predniSONE (DELTASONE) 20 MG tablet Take 40 mg by mouth daily. 09/11/19 09/18/19  [provider]   vitamin B-12 (CYANOCOBALAMIN) 1000 MCG tablet Take 1,000 mcg by mouth daily.    [provider]    Physical Exam:   Vitals:   09/17/19 0442 09/17/19 0445 09/17/19 0758 09/17/19 0851  BP:  (!) 161/51 (!) 155/90 (!) 154/75  Pulse: 84  83 68  Resp: 20  19 (!) 21  Temp:   98.3 F (36.8 C)   TempSrc:   Oral   SpO2: 94%  95% 95%  Weight:      Height:         Physical Exam: Blood pressure (!) 154/75, pulse 68, temperature 98.3 F (36.8 C), temperature source Oral, resp. rate (!) 21, height 5\' 8"  (1.727 m), weight 77.1 kg, SpO2 95 %. Gen: No acute distress. Head: Normocephalic, atraumatic. Eyes: Pupils equal, round and reactive to light. Extraocular movements intact.  Sclerae nonicteric.  Mouth: Dry mucous membranes Neck: Supple, no thyromegaly, no lymphadenopathy, no jugular venous distention. Chest: Air entry adequate bilaterally. No rhonchi or wheezes but he has bilateral rales CV: Heart sounds are regular with an S1, S2. No murmurs, rubs or gallops.  Abdomen: Soft, nontender, nondistended with normal active bowel sounds. No palpable masses. Extremities: Extremities are without clubbing, or cyanosis. No edema. Pedal pulses 2+.  Skin: Warm and dry. No rashes Neuro: Alert and oriented times 3; grossly nonfocal.  Psych: Insight is good and judgment is appropriate. Mood and affect normal.   Data Review:    Labs: Basic Metabolic Panel: Recent Labs  Lab 09/16/19 2219  NA 124*  K 3.9  CL 89*  CO2 23  GLUCOSE 107*  BUN 18  CREATININE 1.02  CALCIUM 8.4*   Liver Function Tests: Recent Labs  Lab 09/16/19 2219  AST 40  ALT 22  ALKPHOS 67  BILITOT 1.0  PROT 7.6  ALBUMIN 3.7   Recent Labs  Lab 09/16/19 2219  LIPASE 60*   No results for input(s): AMMONIA in the last 168 hours. CBC: Recent Labs  Lab 09/16/19 2219  WBC 6.2  NEUTROABS 5.2  HGB 14.1  HCT 39.6  MCV 88.4  PLT 217   Cardiac Enzymes: No results for input(s): CKTOTAL, CKMB, CKMBINDEX,  TROPONINI in the last 168 hours.  BNP (last 3 results) No results for input(s): PROBNP in the last 8760 hours. CBG: No results for input(s): GLUCAP in the last 168 hours.  Urinalysis No results found for: COLORURINE, APPEARANCEUR, LABSPEC, PHURINE, GLUCOSEU, HGBUR, BILIRUBINUR, KETONESUR, PROTEINUR, UROBILINOGEN, NITRITE, LEUKOCYTESUR    Radiographic Studies: DG Chest Portable 1 View  Result Date: 09/17/2019 CLINICAL DATA:  Cough EXAM: PORTABLE CHEST 1 VIEW COMPARISON:  None. FINDINGS: Mild  cardiomegaly with calcific aortic atherosclerosis. Remote median sternotomy. Bilateral basilar predominant head air central opacities. No pleural effusion or pneumothorax. IMPRESSION: Bilateral basilar predominant air central opacities, which may indicate pulmonary edema or infection. Electronically Signed   By: Ulyses Jarred M.D.   On: 09/17/2019 04:59       Assessment/Plan:   Active Problems:   Pneumonia due to COVID-19 virus   Hyponatremia   Body mass index is 25.85 kg/m.    COVID-19 pneumonia: Admit to MedSurg.  Treat with IV remdesivir and dexamethasone.  Acute hypoxemic respiratory failure: 4 L/min oxygen via nasal cannula  Hypertensive urgency: BP has improved.  Continue Coreg.  Hyponatremia: Cautious hydration with IV fluids and follow-up BMP  CAD: Continue aspirin  Other information:   DVT prophylaxis: Lovenox Code Status: Full code. Family Communication: Plan discussed with the patient Disposition Plan: Possible discharge home in 2 to 3 days depending on clinical improvement Consults called: None Admission status: Inpatient   The medical decision making is of moderate complexity, therefore this is a level 2 visit.  Time spent 48 minutes  Gildardo Tickner [facility]   How to contact the [facility] Attending or Consulting provider [place] or covering provider during after hours [place], for this patient?   1. Check the care team in [facility] and look for a)  attending/consulting [facility] provider listed and b) the [facility] team listed 2. Log into [email or web address] and use [place]'s universal password to access. If you do not have the password, please contact the hospital operator. 3. Locate the [facility] provider you are looking for under [facility] and page to a number that you can be directly reached. 4. If you still have difficulty reaching the provider, please page the [facility] (Director on Call) for the Hospitalists listed on amion for assistance.  09/17/2019, 8:59 AM

## 2019-09-17 NOTE — ED Notes (Signed)
Pt asleep in bed, VSS.  NAD

## 2019-09-17 NOTE — ED Notes (Signed)
Updated daughter on plan of care.  

## 2019-09-17 NOTE — ED Notes (Signed)
Pt provided breakfast tray, placed at bedside table d/t pt being asleep at this time.

## 2019-09-18 LAB — CBC WITH DIFFERENTIAL/PLATELET
Abs Immature Granulocytes: 0.05 10*3/uL (ref 0.00–0.07)
Basophils Absolute: 0 10*3/uL (ref 0.0–0.1)
Basophils Relative: 0 %
Eosinophils Absolute: 0 10*3/uL (ref 0.0–0.5)
Eosinophils Relative: 0 %
HCT: 36.1 % — ABNORMAL LOW (ref 39.0–52.0)
Hemoglobin: 12.6 g/dL — ABNORMAL LOW (ref 13.0–17.0)
Immature Granulocytes: 1 %
Lymphocytes Relative: 15 %
Lymphs Abs: 0.9 10*3/uL (ref 0.7–4.0)
MCH: 31.1 pg (ref 26.0–34.0)
MCHC: 34.9 g/dL (ref 30.0–36.0)
MCV: 89.1 fL (ref 80.0–100.0)
Monocytes Absolute: 0.5 10*3/uL (ref 0.1–1.0)
Monocytes Relative: 9 %
Neutro Abs: 4.6 10*3/uL (ref 1.7–7.7)
Neutrophils Relative %: 75 %
Platelets: 206 10*3/uL (ref 150–400)
RBC: 4.05 MIL/uL — ABNORMAL LOW (ref 4.22–5.81)
RDW: 13.1 % (ref 11.5–15.5)
Smear Review: NORMAL
WBC: 6.1 10*3/uL (ref 4.0–10.5)
nRBC: 0 % (ref 0.0–0.2)

## 2019-09-18 LAB — COMPREHENSIVE METABOLIC PANEL
ALT: 18 U/L (ref 0–44)
AST: 31 U/L (ref 15–41)
Albumin: 2.9 g/dL — ABNORMAL LOW (ref 3.5–5.0)
Alkaline Phosphatase: 50 U/L (ref 38–126)
Anion gap: 10 (ref 5–15)
BUN: 21 mg/dL (ref 8–23)
CO2: 20 mmol/L — ABNORMAL LOW (ref 22–32)
Calcium: 7.9 mg/dL — ABNORMAL LOW (ref 8.9–10.3)
Chloride: 99 mmol/L (ref 98–111)
Creatinine, Ser: 0.81 mg/dL (ref 0.61–1.24)
GFR calc Af Amer: >60 mL/min (ref >60)
GFR calc non Af Amer: >60 mL/min (ref >60)
Glucose, Bld: 100 mg/dL — ABNORMAL HIGH (ref 70–99)
Potassium: 3.9 mmol/L (ref 3.5–5.1)
Sodium: 129 mmol/L — ABNORMAL LOW (ref 135–145)
Total Bilirubin: 1 mg/dL (ref 0.3–1.2)
Total Protein: 6 g/dL — ABNORMAL LOW (ref 6.5–8.1)

## 2019-09-18 MED ORDER — ADULT MULTIVITAMIN W/MINERALS CH
1.0000 | ORAL_TABLET | Freq: Every day | ORAL | Status: DC
  Administered 2019-09-19 – 2019-09-21 (×3): 1 via ORAL
  Filled 2019-09-18 (×3): qty 1

## 2019-09-18 MED ORDER — TRAZODONE HCL 50 MG PO TABS
25.0000 mg | ORAL_TABLET | Freq: Once | ORAL | Status: AC
  Administered 2019-09-18: 25 mg via ORAL
  Filled 2019-09-18: qty 1

## 2019-09-18 MED ORDER — ENSURE ENLIVE PO LIQD
237.0000 mL | Freq: Three times a day (TID) | ORAL | Status: DC
  Administered 2019-09-19 – 2019-09-21 (×7): 237 mL via ORAL

## 2019-09-18 NOTE — Plan of Care (Signed)
  Problem: Education: Goal: Knowledge of risk factors and measures for prevention of condition will improve Outcome: Progressing   Problem: Coping: Goal: Psychosocial and spiritual needs will be supported Outcome: Progressing   Problem: Respiratory: Goal: Will maintain a patent airway Outcome: Progressing Goal: Complications related to the disease process, condition or treatment will be avoided or minimized Outcome: Progressing   

## 2019-09-18 NOTE — Progress Notes (Signed)
Initial Nutrition Assessment  DOCUMENTATION CODES:   Not applicable  INTERVENTION:   Ensure Enlive po TID, each supplement provides 350 kcal and 20 grams of protein  Magic cup TID with meals, each supplement provides 290 kcal and 9 grams of protein  MVI daily   Pt is likely at moderate refeed risk; recommend monitor K, Mg and P labs daily as oral intake improves.   NUTRITION DIAGNOSIS:   Increased nutrient needs related to catabolic illness(COVID 19) as evidenced by increased estimated needs.  GOAL:   Patient will meet greater than or equal to 90% of their needs  MONITOR:   PO intake, Supplement acceptance, Labs, Weight trends, Skin, I & O's  REASON FOR ASSESSMENT:   Malnutrition Screening Tool    ASSESSMENT:   79 y.o. male with medical history significant for hypertension, CAD, who was brought to the hospital because of nausea and diarrhea and found to have COVID 19   Spoke with patient via phone. Pt reports poor appetite and oral intake for several days pta r/t nausea and vomiting. Pt reports his nausea and vomiting is resolved but that his appetite is still poor. Pt reports eating some grapes today. Pt reports that he loves Ensure and is willing to drink this in hospital; RD will order. RD will also add MVI and Magic Cups to help pt meet his estimated needs. Pt is likely at refeed risk. Pt unsure of any recent weight loss. There is no weight history in chart to determine if any significant weight loss pta.   Medications reviewed and include: aspirin, D3, dexamethasone, lovenox, B12, NaCl @50ml /hr  Labs reviewed: Na 129(L)  Unable to complete Nutrition-Focused physical exam at this time as pt with COVID 19.   Diet Order:   Diet Order            Diet 2 gram sodium Room service appropriate? Yes; Fluid consistency: Thin  Diet effective now             EDUCATION NEEDS:   Education needs have been addressed  Skin:  Skin Assessment: Reviewed RN Assessment  Last  BM:  1/9  Height:   Ht Readings from Last 1 Encounters:  09/16/19 5\' 8"  (1.727 m)    Weight:   Wt Readings from Last 1 Encounters:  09/16/19 77.1 kg    Ideal Body Weight:  70 kg  BMI:  Body mass index is 25.85 kg/m.  Estimated Nutritional Needs:   Kcal:  2000-2300kcal/day  Protein:  100-115g/day  Fluid:  >1.8L/day  MS, RD, LDN Pager #- [phone] Office#- [phone] After Hours Pager: [phone]

## 2019-09-18 NOTE — Progress Notes (Signed)
PROGRESS NOTE    Bobby STATZ Sr.  WCB:[id number] DOB: 06-21-1941 DOA: 09/17/2019 PCP: [facility]    Assessment & Plan:   Principal Problem:   Pneumonia due to COVID-19 virus Active Problems:   Hyponatremia    Bobby BOYD Sr. is an 79 y.o. Caucasian male with medical history significant for hypertension, CAD, who was brought to the hospital because of nausea and diarrhea.       Acute hypoxemic respiratory failure 2/2 COVID-19 PNA --4 L/min oxygen via nasal cannula PLAN: --continue IV remdesivir and dexamethasone.  Hypertensive urgency BP has improved.   Continue Coreg.  Hyponatremia --continue gentle IV fluids and follow-up BMP  CAD --Continue aspirin   DVT prophylaxis: Lovenox SQ Code Status: Full code  Disposition Plan: Home   Subjective and Interval History:  Pt reported nausea and diarrhea improved.  Did not feel dyspnea.  Reported feeling restless and anxious.  No fever, chest pain, abdominal pain, dysuria, increased swelling.  Wanted to get up to a chair.   Objective: Vitals:   09/18/19 1500 09/18/19 1658 09/18/19 2100 09/19/19 0002  BP:  (!) 155/82 (!) 150/80 (!) 148/92  Pulse: 65 68 62 90  Resp: 19 (!) 22 17 17   Temp:  97.7 F (36.5 C) 97.8 F (36.6 C) 98.2 F (36.8 C)  TempSrc:  Oral Oral Axillary  SpO2:   97% 94%  Weight:      Height:        Intake/Output Summary (Last 24 hours) at 09/19/2019 0749 Last data filed at 09/19/2019 0616 Gross per 24 hour  Intake 1291.98 ml  Output 1900 ml  Net -608.02 ml   Filed Weights   09/16/19 2211  Weight: 77.1 kg    Examination:   Constitutional: NAD, AAOx3 HEENT: conjunctivae and lids normal, EOMI CV: RRR no M,R,G. Distal pulses +2.  No cyanosis.   RESP: CTA B/L, no crackles, on 4L GI: +BS, NTND Extremities: No effusions, edema, or tenderness in BLE SKIN: warm, dry and intact Neuro: II - XII grossly intact.  Sensation intact Psych: Anxious and depressed mood and  affect.      Data Reviewed: I have personally reviewed following labs and imaging studies  CBC: Recent Labs  Lab 09/16/19 2219 09/18/19 0557  WBC 6.2 6.1  NEUTROABS 5.2 4.6  HGB 14.1 12.6*  HCT 39.6 36.1*  MCV 88.4 89.1  PLT 217 206   Basic Metabolic Panel: Recent Labs  Lab 09/16/19 2219 09/18/19 0557 09/19/19 0342  NA 124* 129* 131*  K 3.9 3.9 3.7  CL 89* 99 98  CO2 23 20* 25  GLUCOSE 107* 100* 96  BUN 18 21 18   CREATININE 1.02 0.81 0.77  CALCIUM 8.4* 7.9* 8.1*   GFR: Estimated Creatinine Clearance: 73.6 mL/min (by C-G formula based on SCr of 0.77 mg/dL). Liver Function Tests: Recent Labs  Lab 09/16/19 2219 09/18/19 0557  AST 40 31  ALT 22 18  ALKPHOS 67 50  BILITOT 1.0 1.0  PROT 7.6 6.0*  ALBUMIN 3.7 2.9*   Recent Labs  Lab 09/16/19 2219  LIPASE 60*   No results for input(s): AMMONIA in the last 168 hours. Coagulation Profile: No results for input(s): INR, PROTIME in the last 168 hours. Cardiac Enzymes: No results for input(s): CKTOTAL, CKMB, CKMBINDEX, TROPONINI in the last 168 hours. BNP (last 3 results) No results for input(s): PROBNP in the last 8760 hours. HbA1C: No results for input(s): HGBA1C in the last 72 hours. CBG: No results  for input(s): GLUCAP in the last 168 hours. Lipid Profile: No results for input(s): CHOL, HDL, LDLCALC, TRIG, CHOLHDL, LDLDIRECT in the last 72 hours. Thyroid Function Tests: No results for input(s): TSH, T4TOTAL, FREET4, T3FREE, THYROIDAB in the last 72 hours. Anemia Panel: No results for input(s): VITAMINB12, FOLATE, FERRITIN, TIBC, IRON, RETICCTPCT in the last 72 hours. Sepsis Labs: No results for input(s): PROCALCITON, LATICACIDVEN in the last 168 hours.  Recent Results (from the past 240 hour(s))  Respiratory Panel by RT PCR (Flu A&B, Covid) - Nasopharyngeal Swab     Status: Abnormal   Collection Time: 09/17/19  3:46 AM   Specimen: Nasopharyngeal Swab  Result Value Ref Range Status   SARS Coronavirus  2 by RT PCR POSITIVE (A) NEGATIVE Final    Comment: RESULT CALLED TO, READ BACK BY AND VERIFIED WITH: Annabelle Harman [facility] 09/17/19 AT 0454 HS    Influenza A by PCR NEGATIVE NEGATIVE Final   Influenza B by PCR NEGATIVE NEGATIVE Final    Comment: (NOTE) The Xpert Xpress SARS-CoV-2/FLU/RSV assay is intended as an aid in  the diagnosis of influenza from Nasopharyngeal swab specimens and  should not be used as a sole basis for treatment. Nasal washings and  aspirates are unacceptable for Xpert Xpress SARS-CoV-2/FLU/RSV  testing. Fact Sheet for Patients: PinkCheek.be Fact Sheet for Healthcare Providers: GravelBags.it This test is not yet approved or cleared by the [place] FDA and  has been authorized for detection and/or diagnosis of SARS-CoV-2 by  FDA under an Emergency Use Authorization (EUA). This EUA will remain  in effect (meaning this test can be used) for the duration of the  Covid-19 declaration under Section 564(b)(1) of the Act, 21  U.S.C. section 360bbb-3(b)(1), unless the authorization is  terminated or revoked. Performed at [facility], [address], [place], [place] [address]       Radiology Studies: No results found.   Scheduled Meds: . aspirin  81 mg Oral Daily  . carvedilol  6.25 mg Oral BID WC  . cholecalciferol  1,000 Units Oral Daily  . dexamethasone (DECADRON) injection  6 mg Intravenous Q24H  . enoxaparin (LOVENOX) injection  40 mg Subcutaneous Q24H  . feeding supplement (ENSURE ENLIVE)  237 mL Oral TID BM  . latanoprost  1 drop Both Eyes QHS  . multivitamin with minerals  1 tablet Oral Daily  . vitamin B-12  1,000 mcg Oral Daily   Continuous Infusions: . sodium chloride 50 mL/hr at 09/19/19 0129  . remdesivir 100 mg in NS 100 mL Stopped (09/18/19 1930)     LOS: 2 days     Enzo Bi, MD [facility] If 7PM-7AM, please contact night-coverage 09/19/2019, 7:49 AM

## 2019-09-19 LAB — BASIC METABOLIC PANEL
Anion gap: 8 (ref 5–15)
BUN: 18 mg/dL (ref 8–23)
CO2: 25 mmol/L (ref 22–32)
Calcium: 8.1 mg/dL — ABNORMAL LOW (ref 8.9–10.3)
Chloride: 98 mmol/L (ref 98–111)
Creatinine, Ser: 0.77 mg/dL (ref 0.61–1.24)
GFR calc Af Amer: >60 mL/min (ref >60)
GFR calc non Af Amer: >60 mL/min (ref >60)
Glucose, Bld: 96 mg/dL (ref 70–99)
Potassium: 3.7 mmol/L (ref 3.5–5.1)
Sodium: 131 mmol/L — ABNORMAL LOW (ref 135–145)

## 2019-09-19 MED ORDER — TRAZODONE HCL 50 MG PO TABS
25.0000 mg | ORAL_TABLET | Freq: Once | ORAL | Status: AC
  Administered 2019-09-19: 01:00:00 25 mg via ORAL
  Filled 2019-09-19: qty 1

## 2019-09-19 NOTE — Progress Notes (Signed)
PROGRESS NOTE    Bobby PAFF Sr.  FUX:[id number] DOB: 12-11-1940 DOA: 09/17/2019 [facility]: [facility]    Assessment & Plan:   Principal Problem:   Pneumonia due to COVID-19 virus Active Problems:   Hyponatremia    Bobby DEBOLD Sr. is an 79 y.o. Caucasian male with medical history significant for hypertension, CAD, who was brought to the hospital because of nausea and diarrhea.       Acute hypoxemic respiratory failure 2/2 COVID-19 PNA --4 L/min oxygen via nasal cannula on presentation. PLAN: --continue IV remdesivir (started on 1/10) and dexamethasone.  Hypertensive urgency BP has improved.   Continue Coreg.  Hyponatremia, improved --Likely due to volume depletion.  Improved with gentle IV fluids. --Hold further IVF and encourage PO hydration.  CAD --Continue aspirin   DVT prophylaxis: Lovenox SQ Code Status: Full code  Disposition Plan: Home   Subjective and Interval History:  Pt did not answer ROS questions today, appeared very depressed to me and the nursing.  Suppl O2 had been removed, but saturation stable ~90%.  No fever, N/V/D.    Objective: Vitals:   09/19/19 0900 09/19/19 1000 09/19/19 1100 09/19/19 1638  BP: (!) 168/82   118/64  Pulse: 81 88 73 68  Resp: (!) 21 (!) 22 19 19   Temp: 98.3 F (36.8 C)   97.6 F (36.4 C)  TempSrc: Oral   Oral  SpO2: 93% 96% 94% 97%  Weight:      Height:        Intake/Output Summary (Last 24 hours) at 09/19/2019 1852 Last data filed at 09/19/2019 0616 Gross per 24 hour  Intake 517.48 ml  Output 1450 ml  Net -932.52 ml   Filed Weights   09/16/19 2211  Weight: 77.1 kg    Examination:   Constitutional: NAD, AAOx3 HEENT: conjunctivae and lids normal, EOMI CV: RRR no M,R,G. Distal pulses +2.  No cyanosis.   RESP: CTA B/L, no crackles, on RA GI: +BS, NTND Extremities: No effusions, edema, or tenderness in BLE SKIN: warm, dry and intact Neuro: II - XII grossly intact.  Sensation  intact Psych: depressed mood and affect.      Data Reviewed: I have personally reviewed following labs and imaging studies  CBC: Recent Labs  Lab 09/16/19 2219 09/18/19 0557  WBC 6.2 6.1  NEUTROABS 5.2 4.6  HGB 14.1 12.6*  HCT 39.6 36.1*  MCV 88.4 89.1  PLT 217 025   Basic Metabolic Panel: Recent Labs  Lab 09/16/19 2219 09/18/19 0557 09/19/19 0342  NA 124* 129* 131*  K 3.9 3.9 3.7  CL 89* 99 98  CO2 23 20* 25  GLUCOSE 107* 100* 96  BUN 18 21 18   CREATININE 1.02 0.81 0.77  CALCIUM 8.4* 7.9* 8.1*   GFR: Estimated Creatinine Clearance: 73.6 mL/min (by C-G formula based on SCr of 0.77 mg/dL). Liver Function Tests: Recent Labs  Lab 09/16/19 2219 09/18/19 0557  AST 40 31  ALT 22 18  ALKPHOS 67 50  BILITOT 1.0 1.0  PROT 7.6 6.0*  ALBUMIN 3.7 2.9*   Recent Labs  Lab 09/16/19 2219  LIPASE 60*   No results for input(s): AMMONIA in the last 168 hours. Coagulation Profile: No results for input(s): INR, PROTIME in the last 168 hours. Cardiac Enzymes: No results for input(s): CKTOTAL, CKMB, CKMBINDEX, TROPONINI in the last 168 hours. BNP (last 3 results) No results for input(s): PROBNP in the last 8760 hours. HbA1C: No results for input(s): HGBA1C in the  last 72 hours. CBG: No results for input(s): GLUCAP in the last 168 hours. Lipid Profile: No results for input(s): CHOL, HDL, LDLCALC, TRIG, CHOLHDL, LDLDIRECT in the last 72 hours. Thyroid Function Tests: No results for input(s): TSH, T4TOTAL, FREET4, T3FREE, THYROIDAB in the last 72 hours. Anemia Panel: No results for input(s): VITAMINB12, FOLATE, FERRITIN, TIBC, IRON, RETICCTPCT in the last 72 hours. Sepsis Labs: No results for input(s): PROCALCITON, LATICACIDVEN in the last 168 hours.  Recent Results (from the past 240 hour(s))  Respiratory Panel by RT PCR (Flu A&B, Covid) - Nasopharyngeal Swab     Status: Abnormal   Collection Time: 09/17/19  3:46 AM   Specimen: Nasopharyngeal Swab  Result Value Ref  Range Status   SARS Coronavirus 2 by RT PCR POSITIVE (A) NEGATIVE Final    Comment: RESULT CALLED TO, READ BACK BY AND VERIFIED WITH: Shelby Dubin [facility] 09/17/19 AT 0454 HS    Influenza A by PCR NEGATIVE NEGATIVE Final   Influenza B by PCR NEGATIVE NEGATIVE Final    Comment: (NOTE) The Xpert Xpress SARS-CoV-2/FLU/RSV assay is intended as an aid in  the diagnosis of influenza from Nasopharyngeal swab specimens and  should not be used as a sole basis for treatment. Nasal washings and  aspirates are unacceptable for Xpert Xpress SARS-CoV-2/FLU/RSV  testing. Fact Sheet for Patients: https://www.moore.com/ Fact Sheet for Healthcare Providers: https://www.young.biz/ This test is not yet approved or cleared by the [place] FDA and  has been authorized for detection and/or diagnosis of SARS-CoV-2 by  FDA under an Emergency Use Authorization (EUA). This EUA will remain  in effect (meaning this test can be used) for the duration of the  Covid-19 declaration under Section 564(b)(1) of the Act, 21  U.S.C. section 360bbb-3(b)(1), unless the authorization is  terminated or revoked. Performed at [facility], [address], [place], [place] [address]       Radiology Studies: No results found.   Scheduled Meds: . aspirin  81 mg Oral Daily  . carvedilol  6.25 mg Oral BID WC  . cholecalciferol  1,000 Units Oral Daily  . dexamethasone (DECADRON) injection  6 mg Intravenous Q24H  . enoxaparin (LOVENOX) injection  40 mg Subcutaneous Q24H  . feeding supplement (ENSURE ENLIVE)  237 mL Oral TID BM  . latanoprost  1 drop Both Eyes QHS  . multivitamin with minerals  1 tablet Oral Daily  . vitamin B-12  1,000 mcg Oral Daily   Continuous Infusions: . remdesivir 100 mg in NS 100 mL 100 mg (09/19/19 1031)     LOS: 2 days     Darlin Priestly, MD [facility] If 7PM-7AM, please contact night-coverage 09/19/2019, 6:52 PM

## 2019-09-20 LAB — BASIC METABOLIC PANEL
Anion gap: 10 (ref 5–15)
BUN: 24 mg/dL — ABNORMAL HIGH (ref 8–23)
CO2: 25 mmol/L (ref 22–32)
Calcium: 8.2 mg/dL — ABNORMAL LOW (ref 8.9–10.3)
Chloride: 98 mmol/L (ref 98–111)
Creatinine, Ser: 0.75 mg/dL (ref 0.61–1.24)
GFR calc Af Amer: >60 mL/min (ref >60)
GFR calc non Af Amer: >60 mL/min (ref >60)
Glucose, Bld: 134 mg/dL — ABNORMAL HIGH (ref 70–99)
Potassium: 4 mmol/L (ref 3.5–5.1)
Sodium: 133 mmol/L — ABNORMAL LOW (ref 135–145)

## 2019-09-20 LAB — FERRITIN: Ferritin: 555 ng/mL — ABNORMAL HIGH (ref 24–336)

## 2019-09-20 LAB — CBC
HCT: 35.7 % — ABNORMAL LOW (ref 39.0–52.0)
Hemoglobin: 12.6 g/dL — ABNORMAL LOW (ref 13.0–17.0)
MCH: 31.5 pg (ref 26.0–34.0)
MCHC: 35.3 g/dL (ref 30.0–36.0)
MCV: 89.3 fL (ref 80.0–100.0)
Platelets: 228 10*3/uL (ref 150–400)
RBC: 4 MIL/uL — ABNORMAL LOW (ref 4.22–5.81)
RDW: 12.8 % (ref 11.5–15.5)
WBC: 4.7 10*3/uL (ref 4.0–10.5)
nRBC: 0 % (ref 0.0–0.2)

## 2019-09-20 LAB — C-REACTIVE PROTEIN: CRP: 12.3 mg/dL — ABNORMAL HIGH (ref <1.0)

## 2019-09-20 LAB — MAGNESIUM: Magnesium: 2.2 mg/dL (ref 1.7–2.4)

## 2019-09-20 LAB — FIBRIN DERIVATIVES D-DIMER (ARMC ONLY): Fibrin derivatives D-dimer (ARMC): 792.52 ng/mL (FEU) — ABNORMAL HIGH (ref 0.00–499.00)

## 2019-09-20 LAB — PROCALCITONIN: Procalcitonin: <0.1 ng/mL

## 2019-09-20 MED ORDER — GABAPENTIN 300 MG PO CAPS
600.0000 mg | ORAL_CAPSULE | Freq: Every day | ORAL | Status: DC
  Administered 2019-09-20: 600 mg via ORAL
  Filled 2019-09-20: qty 2

## 2019-09-20 MED ORDER — DIPHENHYDRAMINE HCL 25 MG PO CAPS: 25.0000 mg | ORAL_CAPSULE | Freq: Every evening | ORAL | Status: DC | PRN

## 2019-09-20 NOTE — TOC Initial Note (Signed)
Transition of Care (TOC) - Initial/Assessment Note    Patient Details  Name: Bobby Strong. MRN: [id number] Date of Birth: [birth year]  Transition of Care [facility]) CM/SW Contact:    Allayne Butcher, RN Phone Number: 09/20/2019, 3:33 PM  Clinical Narrative:                 Patient admitted to the hospital for COVID 19.  Patient is currently not requiring any oxygen.  Patient is from home where he lives with his wife.  Patient reports he is independent in ADL's and drives.  Patient [facility] to the [facility] for [facility]CP services.  [facility] and [facility].  RNCM contacted Case Manager Wonda Cerise with the [facility] [employer].   Patient would like home health services at discharge, RNCM will arrange and check with patient about who he prefers for choice of agency.   RNCM will cont to follow and assist with discharge planning.   Expected Discharge Plan: Home w Home Health Services Barriers to Discharge: Continued Medical Work up   Patient Goals and CMS Choice Patient states their goals for this hospitalization and ongoing recovery are:: To get well and not have to come back to the hospital CMS Medicare.gov Compare Post Acute Care list provided to:: Patient Choice offered to / list presented to : Patient  Expected Discharge Plan and Services Expected Discharge Plan: Home w Home Health Services   Discharge Planning Services: CM Consult Post Acute Care Choice: Home Health Living arrangements for the past 2 months: Single Family Home                                      Prior Living Arrangements/Services Living arrangements for the past 2 months: Single Family Home Lives with:: Spouse Patient language and need for interpreter reviewed:: Yes Do you feel safe going back to the place where you live?: Yes      Need for Family Participation in Patient Care: Yes (Comment) Care giver support system in place?: Yes (comment)(wife)    Criminal Activity/Legal Involvement Pertinent to Current Situation/Hospitalization: No - Comment as needed  Activities of Daily Living Home Assistive Devices/Equipment: None ADL Screening (condition at time of admission) Patient's cognitive ability adequate to safely complete daily activities?: Yes Is the patient deaf or have difficulty hearing?: No Does the patient have difficulty seeing, even when wearing glasses/contacts?: No Does the patient have difficulty concentrating, remembering, or making decisions?: No Patient able to express need for assistance with ADLs?: Yes Does the patient have difficulty dressing or bathing?: No Independently performs ADLs?: Yes (appropriate for developmental age) Does the patient have difficulty walking or climbing stairs?: Yes Weakness of Legs: Both Weakness of Arms/Hands: None  Permission Sought/Granted Permission sought to share information with : Case Manager, Family Supports, Other (comment), Magazine features editor Permission granted to share information with : Yes, Verbal Permission Granted     Permission granted to share info w AGENCY: Home Health Agency and [employer]  Permission granted to share info w Relationship: Wife     Emotional Assessment   Attitude/Demeanor/Rapport: Engaged Affect (typically observed): Accepting Orientation: : Oriented to Self, Oriented to Place, Oriented to  Time, Oriented to Situation Alcohol / Substance Use: Not Applicable Psych Involvement: No (comment)  Admission diagnosis:  Hyponatremia [E87.1] COVID-19 virus infection [U07.1] Pneumonia due to  COVID-19 virus [U07.1, J12.82] Patient Active Problem List   Diagnosis Date Noted  . Pneumonia due to COVID-19 virus 09/17/2019  . Hyponatremia 09/17/2019   PCP:  [facility] [place]:   [facility] [place], [place] AT [facility] OF [address] [place] [address] Phone: [phone]  Fax: [phone]     Social Determinants of Health (SDOH) Interventions    Readmission Risk Interventions No flowsheet data found.

## 2019-09-20 NOTE — Progress Notes (Signed)
PROGRESS NOTE    Bobby CARMER Sr.  WIO:[id number] DOB: 07-27-1941 DOA: 09/17/2019 PCP: [facility]     Brief Narrative:  Bobby Strongis an 79 y.o.Caucasian malewith medical history significant for hypertension, CAD, who was brought to the hospital because of nausea and diarrhea.    Found to be Covid positive on presentation and hypoxic  1/13: Clinically improving.  Ambulating around the room on my arrival.  Ambulating on room air and did not desaturate below 90% upon rest.  Communicative.  Improving.  Assessment & Plan:   Principal Problem:   Pneumonia due to COVID-19 virus Active Problems:   Hyponatremia  Acute hypoxemic respiratory failure 2/2 COVID-19 PNA --4 L/min oxygen via nasal cannula on presentation. --Remains on 4 L of symptomatically improving PLAN: -continue IV remdesivir (1/10-  )  -Continue IV dexamethasone. (1/10-  ) -Prone as tolerated -Stress IS and flutter use -Trend inflammatory markers -Covid isolation  Hypertensive urgency BP has improved.  Continue Coreg.  Hyponatremia, improved --Likely due to volume depletion.  Improved with gentle IV fluids. --Hold further IVF and encourage PO hydration.  CAD --Continue aspirin   DVT prophylaxis: Lovenox Code Status: Full Family Communication: Wife Clara via phone [phone] on 1/13 Disposition Plan: Home with home health  Consultants:   none  Procedures:  none  Antimicrobials:   Remdesivir (09/18/19-  )    Subjective: Seen and examined Ambulating around room Feeling better  Objective: Vitals:   09/20/19 0829 09/20/19 1200 09/20/19 1300 09/20/19 1400  BP: (!) 143/71     Pulse: 65  62 63  Resp:  (!) 25 19 19   Temp: 97.9 F (36.6 C)     TempSrc: Oral     SpO2: 95% 92% 91% 91%  Weight:      Height:        Intake/Output Summary (Last 24 hours) at 09/20/2019 1534 Last data filed at 09/20/2019 1210 Gross per 24 hour  Intake --  Output 825 ml   Net -825 ml   Filed Weights   09/16/19 2211  Weight: 77.1 kg    Examination:  General exam: Appears calm and comfortable  Respiratory system: Scattered crackles bilaterally.  Normal work of breathing Cardiovascular system: S1 & S2 heard, RRR. No JVD, murmurs, rubs, gallops or clicks. No pedal edema. Gastrointestinal system: Abdomen is nondistended, soft and nontender. No organomegaly or masses felt. Normal bowel sounds heard. Central nervous system: Alert and oriented. No focal neurological deficits. Extremities: Symmetric 5 x 5 power. Skin: No rashes, lesions or ulcers Psychiatry: Judgement and insight appear normal. Mood & affect appropriate.     Data Reviewed: I have personally reviewed following labs and imaging studies  CBC: Recent Labs  Lab 09/16/19 2219 09/18/19 0557 09/20/19 0651  WBC 6.2 6.1 4.7  NEUTROABS 5.2 4.6  --   HGB 14.1 12.6* 12.6*  HCT 39.6 36.1* 35.7*  MCV 88.4 89.1 89.3  PLT 217 206 228   Basic Metabolic Panel: Recent Labs  Lab 09/16/19 2219 09/18/19 0557 09/19/19 0342 09/20/19 0651  NA 124* 129* 131* 133*  K 3.9 3.9 3.7 4.0  CL 89* 99 98 98  CO2 23 20* 25 25  GLUCOSE 107* 100* 96 134*  BUN 18 21 18  24*  CREATININE 1.02 0.81 0.77 0.75  CALCIUM 8.4* 7.9* 8.1* 8.2*  MG  --   --   --  2.2   GFR: Estimated Creatinine Clearance: 73.6 mL/min (by C-G formula based on SCr of 0.75  mg/dL). Liver Function Tests: Recent Labs  Lab 09/16/19 2219 09/18/19 0557  AST 40 31  ALT 22 18  ALKPHOS 67 50  BILITOT 1.0 1.0  PROT 7.6 6.0*  ALBUMIN 3.7 2.9*   Recent Labs  Lab 09/16/19 2219  LIPASE 60*   No results for input(s): AMMONIA in the last 168 hours. Coagulation Profile: No results for input(s): INR, PROTIME in the last 168 hours. Cardiac Enzymes: No results for input(s): CKTOTAL, CKMB, CKMBINDEX, TROPONINI in the last 168 hours. BNP (last 3 results) No results for input(s): PROBNP in the last 8760 hours. HbA1C: No results for  input(s): HGBA1C in the last 72 hours. CBG: No results for input(s): GLUCAP in the last 168 hours. Lipid Profile: No results for input(s): CHOL, HDL, LDLCALC, TRIG, CHOLHDL, LDLDIRECT in the last 72 hours. Thyroid Function Tests: No results for input(s): TSH, T4TOTAL, FREET4, T3FREE, THYROIDAB in the last 72 hours. Anemia Panel: Recent Labs    09/20/19 1130  FERRITIN 555*   Sepsis Labs: Recent Labs  Lab 09/20/19 1130  PROCALCITON <0.10    Recent Results (from the past 240 hour(s))  Respiratory Panel by RT PCR (Flu A&B, Covid) - Nasopharyngeal Swab     Status: Abnormal   Collection Time: 09/17/19  3:46 AM   Specimen: Nasopharyngeal Swab  Result Value Ref Range Status   SARS Coronavirus 2 by RT PCR POSITIVE (A) NEGATIVE Final    Comment: RESULT CALLED TO, READ BACK BY AND VERIFIED WITH: Annabelle Harman [facility] 09/17/19 AT 0454 HS    Influenza A by PCR NEGATIVE NEGATIVE Final   Influenza B by PCR NEGATIVE NEGATIVE Final    Comment: (NOTE) The Xpert Xpress SARS-CoV-2/FLU/RSV assay is intended as an aid in  the diagnosis of influenza from Nasopharyngeal swab specimens and  should not be used as a sole basis for treatment. Nasal washings and  aspirates are unacceptable for Xpert Xpress SARS-CoV-2/FLU/RSV  testing. Fact Sheet for Patients: PinkCheek.be Fact Sheet for Healthcare Providers: GravelBags.it This test is not yet approved or cleared by the [place] FDA and  has been authorized for detection and/or diagnosis of SARS-CoV-2 by  FDA under an Emergency Use Authorization (EUA). This EUA will remain  in effect (meaning this test can be used) for the duration of the  Covid-19 declaration under Section 564(b)(1) of the Act, 21  U.S.C. section 360bbb-3(b)(1), unless the authorization is  terminated or revoked. Performed at [facility], [address], [place], [place] [address]          Radiology  Studies: No results found.      Scheduled Meds: . aspirin  81 mg Oral Daily  . carvedilol  6.25 mg Oral BID WC  . cholecalciferol  1,000 Units Oral Daily  . dexamethasone (DECADRON) injection  6 mg Intravenous Q24H  . enoxaparin (LOVENOX) injection  40 mg Subcutaneous Q24H  . feeding supplement (ENSURE ENLIVE)  237 mL Oral TID BM  . latanoprost  1 drop Both Eyes QHS  . multivitamin with minerals  1 tablet Oral Daily  . vitamin B-12  1,000 mcg Oral Daily   Continuous Infusions: . remdesivir 100 mg in NS 100 mL 100 mg (09/20/19 1002)     LOS: 3 days    Time spent: 35 minutes    Sidney Ace, MD [facility] Pager [phone]  If 7PM-7AM, please contact night-coverage [email or web address] Password [facility] 09/20/2019, 3:34 PM

## 2019-09-20 NOTE — Evaluation (Signed)
Physical Therapy Evaluation Patient Details Name: Bobby Strong. MRN: [id number] DOB: [birth year] Today's Date: 09/20/2019   History of Present Illness  Bobby Strong. is a 28yoM who [facility] c Nausea adn diarrhea, pt c AMS. Weak, tired, and with coughing, dyspnea. PT admitted with [place].  Clinical Impression  Pt admitted with above diagnosis. Pt currently with functional limitations due to the deficits listed below (see "PT Problem List"). Upon entry, pt in bed, awake and agreeable to participate. RN in room. The pt is alert and oriented x4, pleasant, conversational, and generally a good historian. Pt able to perform transfers, bed mobility, and AMB without physical assist. Pt AMB around the entire unit on 4L/,in (as received) and 95% SpO2. Pt AMB a second bout of 61ft on room air finishing up at 89-90% SpO2. MD made aware. Functional mobility assessment demonstrates increased effort/time requirements, poor tolerance, and need for physical assistance, whereas the patient performed these at a higher level of independence PTA. Pt will benefit from skilled PT intervention to increase independence and safety with basic mobility in preparation for discharge to the venue listed below.       Follow Up Recommendations Home health PT    Equipment Recommendations  Rolling walker with 5" wheels    Recommendations for Other Services       Precautions / Restrictions Precautions Precautions: None Restrictions Weight Bearing Restrictions: No      Mobility  Bed Mobility Overal bed mobility: Modified Independent                Transfers Overall transfer level: Modified independent Equipment used: None                Ambulation/Gait Ambulation/Gait assistance: Supervision Gait Distance (Feet): 190 Feet Assistive device: Rolling walker (2 wheeled)       General Gait Details: slow but conversational; Terminal SpO2: 95% on 4L/min. Repeated 90ft on Room  air at 89-90% SpO2 (without device)  Stairs            Wheelchair Mobility    Modified Rankin (Stroke Patients Only)       Balance Overall balance assessment: Mild deficits observed, not formally tested;Modified Independent                                           Pertinent Vitals/Pain Pain Assessment: No/denies pain    Home Living Family/patient expects to be discharged to:: Private residence Living Arrangements: Spouse/significant other Available Help at Discharge: Family Type of Home: House       Home Layout: One level Home Equipment: None      Prior Function Level of Independence: Independent               Hand Dominance        Extremity/Trunk Assessment   Upper Extremity Assessment Upper Extremity Assessment: Overall WFL for tasks assessed    Lower Extremity Assessment Lower Extremity Assessment: Overall WFL for tasks assessed    Cervical / Trunk Assessment Cervical / Trunk Assessment: Normal  Communication      Cognition Arousal/Alertness: Awake/alert Behavior During Therapy: WFL for tasks assessed/performed Overall Cognitive Status: Within Functional Limits for tasks assessed  General Comments      Exercises     Assessment/Plan    PT Assessment Patient needs continued PT services  PT Problem List Decreased strength;Decreased range of motion;Decreased activity tolerance;Decreased balance;Decreased mobility;Decreased cognition;Decreased safety awareness;Decreased knowledge of precautions       PT Treatment Interventions DME instruction;Balance training;Gait training;Stair training;Functional mobility training;Therapeutic activities;Therapeutic exercise;Patient/family education    PT Goals (Current goals can be found in the Care Plan section)  Acute Rehab PT Goals Patient Stated Goal: Return to home and regain strength. PT Goal Formulation: With  patient Time For Goal Achievement: 10/04/19 Potential to Achieve Goals: Good    Frequency Min 2X/week   Barriers to discharge        Co-evaluation               AM-PAC PT "6 Clicks" Mobility  Outcome Measure Help needed turning from your back to your side while in a flat bed without using bedrails?: A Little Help needed moving from lying on your back to sitting on the side of a flat bed without using bedrails?: A Little Help needed moving to and from a bed to a chair (including a wheelchair)?: A Little Help needed standing up from a chair using your arms (e.g., wheelchair or bedside chair)?: A Little Help needed to walk in hospital room?: A Little Help needed climbing 3-5 steps with a railing? : A Little 6 Click Score: 18    End of Session Equipment Utilized During Treatment: Oxygen Activity Tolerance: Patient tolerated treatment well;No increased pain;Patient limited by fatigue Patient left: in chair;with call bell/phone within reach Nurse Communication: Mobility status PT Visit Diagnosis: Unsteadiness on feet (R26.81);Difficulty in walking, not elsewhere classified (R26.2);Muscle weakness (generalized) (M62.81)    Time: 6837-2902 PT Time Calculation (min) (ACUTE ONLY): 31 min   Charges:   PT Evaluation $PT Eval Low Complexity: 1 Low PT Treatments $Therapeutic Exercise: 8-22 mins        1:03 PM, 09/20/19 Rosamaria Lints, PT, DPT Physical Therapist - [facility]  [phone] (ASCOM)    Corion Sherrod C 09/20/2019, 1:00 PM

## 2019-09-21 LAB — BASIC METABOLIC PANEL
Anion gap: 9 (ref 5–15)
BUN: 25 mg/dL — ABNORMAL HIGH (ref 8–23)
CO2: 24 mmol/L (ref 22–32)
Calcium: 8.2 mg/dL — ABNORMAL LOW (ref 8.9–10.3)
Chloride: 98 mmol/L (ref 98–111)
Creatinine, Ser: 0.73 mg/dL (ref 0.61–1.24)
GFR calc Af Amer: >60 mL/min (ref >60)
GFR calc non Af Amer: >60 mL/min (ref >60)
Glucose, Bld: 128 mg/dL — ABNORMAL HIGH (ref 70–99)
Potassium: 4.2 mmol/L (ref 3.5–5.1)
Sodium: 131 mmol/L — ABNORMAL LOW (ref 135–145)

## 2019-09-21 LAB — FIBRIN DERIVATIVES D-DIMER (ARMC ONLY): Fibrin derivatives D-dimer (ARMC): 593.62 ng/mL (FEU) — ABNORMAL HIGH (ref 0.00–499.00)

## 2019-09-21 LAB — CBC
HCT: 34.6 % — ABNORMAL LOW (ref 39.0–52.0)
Hemoglobin: 12.4 g/dL — ABNORMAL LOW (ref 13.0–17.0)
MCH: 31.5 pg (ref 26.0–34.0)
MCHC: 35.8 g/dL (ref 30.0–36.0)
MCV: 87.8 fL (ref 80.0–100.0)
Platelets: 266 10*3/uL (ref 150–400)
RBC: 3.94 MIL/uL — ABNORMAL LOW (ref 4.22–5.81)
RDW: 12.8 % (ref 11.5–15.5)
WBC: 7.3 10*3/uL (ref 4.0–10.5)
nRBC: 0 % (ref 0.0–0.2)

## 2019-09-21 LAB — C-REACTIVE PROTEIN: CRP: 5.2 mg/dL — ABNORMAL HIGH (ref <1.0)

## 2019-09-21 LAB — FERRITIN: Ferritin: 478 ng/mL — ABNORMAL HIGH (ref 24–336)

## 2019-09-21 LAB — MAGNESIUM: Magnesium: 2 mg/dL (ref 1.7–2.4)

## 2019-09-21 MED ORDER — SODIUM CHLORIDE 0.9 % IV SOLN
INTRAVENOUS | Status: DC | PRN
  Administered 2019-09-21: 10:00:00 250 mL via INTRAVENOUS

## 2019-09-21 MED ORDER — DEXAMETHASONE 6 MG PO TABS: 6.0000 mg | ORAL_TABLET | Freq: Every day | ORAL | 0 refills | Status: AC

## 2019-09-21 MED ORDER — DEXAMETHASONE 4 MG PO TABS: 6.0000 mg | ORAL_TABLET | Freq: Every day | ORAL | Status: DC

## 2019-09-21 NOTE — TOC Transition Note (Signed)
Transition of Care [facility]) - CM/SW Discharge Note   Patient Details  Name: Bobby FRISTOE Sr. MRN: [id number] Date of Birth: [birth year]  Transition of Care [facility]) CM/SW Contact:  Allayne Butcher, RN Phone Number: 09/21/2019, 2:37 PM   Clinical Narrative:    Patient is ready for discharge home today with home health services.  This RNCM faxed patient's hospital records to [facility] at [phone] and request for authorization for home health services to be covered by the [facility].  Feliberto Gottron with [facility] has accepted referral for home health services [facility] for RN, PT, and aide.  Rolling walker has been ordered and delivered to the room.  Patient's family will transport him home.     Final next level of care: Home w Home Health Services Barriers to Discharge: Barriers Resolved   Patient Goals and CMS Choice Patient states their goals for this hospitalization and ongoing recovery are:: To get well and not have to come back to the hospital CMS Medicare.gov Compare Post Acute Care list provided to:: Patient Choice offered to / list presented to : Patient  Discharge Placement                       Discharge Plan and Services   Discharge Planning Services: CM Consult Post Acute Care Choice: Home Health          DME Arranged: Walker rolling DME Agency: [employer] Date DME Agency Contacted: 09/21/19 Time DME Agency Contacted: [phone] Representative spoke with at DME Agency: Mitchell Heir HH Arranged: PT, RN, Nurse's Aide HH Agency: [facility] Date HH Agency Contacted: 09/21/19 Time HH Agency Contacted: 1437 Representative spoke with at [facility] Agency: Feliberto Gottron  Social Determinants of Health (SDOH) Interventions     Readmission Risk Interventions No flowsheet data found.

## 2019-09-21 NOTE — Progress Notes (Signed)
Per patient ok to speak daughter Nicoletta Ba [phone]  about condition and give her updates

## 2019-09-21 NOTE — Progress Notes (Signed)
Physical Therapy Treatment Patient Details Name: Bobby Strong Sr. MRN: [id number] DOB: 11/29/1940 Today's Date: 09/21/2019    History of Present Illness Bobby Verrilli Sr. is a 62yoM who comes to [facility] on 1/10 c Nausea adn diarrhea, pt c AMS. Weak, tired, and with coughing, dyspnea. PT admitted with Bobby Strong.    PT Comments    Pt progressing well in general. 95% on room air upon entry. Sats remain 90-96% on room air during 575ft AMB in hall. AMB remains slow but well controlled. Pt prefers continued use of RW at this time for confidence. Pt motivated to continue to AMB frequently upon return to home.    Follow Up Recommendations  Home health PT     Equipment Recommendations  Rolling walker with 5" wheels    Recommendations for Other Services       Precautions / Restrictions Precautions Precautions: None Restrictions Weight Bearing Restrictions: No    Mobility  Bed Mobility Overal bed mobility: Modified Independent                Transfers Overall transfer level: Modified independent                  Ambulation/Gait Ambulation/Gait assistance: Supervision Gait Distance (Feet): 510 Feet Assistive device: Rolling walker (2 wheeled)   Gait velocity: <0.27m/s   General Gait Details: slow but conversational; SpO2: 90-96% throughout   Stairs             Wheelchair Mobility    Modified Rankin (Stroke Patients Only)       Balance Overall balance assessment: Mild deficits observed, not formally tested;Modified Independent                                          Cognition Arousal/Alertness: Awake/alert Behavior During Therapy: WFL for tasks assessed/performed Overall Cognitive Status: Within Functional Limits for tasks assessed                                        Exercises      General Comments        Pertinent Vitals/Pain Pain Assessment: No/denies pain    Home Living                      Prior Function            PT Goals (current goals can now be found in the care plan section) Acute Rehab PT Goals Patient Stated Goal: Return to home and regain strength. PT Goal Formulation: With patient Time For Goal Achievement: 10/04/19 Potential to Achieve Goals: Good Progress towards PT goals: Progressing toward goals    Frequency    Min 2X/week      PT Plan      Co-evaluation              AM-PAC PT "6 Clicks" Mobility   Outcome Measure  Help needed turning from your back to your side while in a flat bed without using bedrails?: None Help needed moving from lying on your back to sitting on the side of a flat bed without using bedrails?: None Help needed moving to and from a bed to a chair (including a wheelchair)?: None Help needed standing up from a chair using your arms (e.g., wheelchair or bedside  chair)?: None Help needed to walk in hospital room?: A Little Help needed climbing 3-5 steps with a railing? : A Little 6 Click Score: 22    End of Session Equipment Utilized During Treatment: Oxygen Activity Tolerance: Patient tolerated treatment well;No increased pain Patient left: in chair;with call bell/phone within reach Nurse Communication: Mobility status PT Visit Diagnosis: Unsteadiness on feet (R26.81);Difficulty in walking, not elsewhere classified (R26.2);Muscle weakness (generalized) (M62.81)     Time: 8546-2703 PT Time Calculation (min) (ACUTE ONLY): 23 min  Charges:  $Therapeutic Exercise: 23-37 mins                     1:35 PM, 09/21/19 Bobby Strong, PT, DPT Physical Therapist - [facility]  [phone] (ASCOM)    Edgerrin Correia C 09/21/2019, 1:34 PM

## 2019-09-21 NOTE — Progress Notes (Signed)
Received MD order to discharge patient to home with home health, reviewed prescriptions, home meds and Health Department  Covid handout with patient  and patient verbalized understanding, patient left with walker provided by home health

## 2019-09-21 NOTE — Care Management Important Message (Signed)
Important Message  Patient Details  Name: Bobby Strong. MRN: [id number] Date of Birth: 1941/04/09   Medicare Important Message Given:  Yes Patient COVID + in isolation unit.  IM reviewed with patient via phone, verbalized understanding.  IM given to bedside RN to deliver to patient's room     Allayne Butcher, RN 09/21/2019, 2:33 PM

## 2019-09-21 NOTE — Discharge Summary (Signed)
Physician Discharge Summary  KALIF KATTNER Sr. TKP:[id number] DOB: 09-26-1940 DOA: 09/17/2019  PCP: [facility]  Admit date: 09/17/2019 Discharge date: 09/21/2019  Admitted From: Home Disposition: Home  Recommendations for Outpatient Follow-up:  1. Follow up with PCP in 1-2 weeks 2.   Home Health: Yes Equipment/Devices: Rolling walker Discharge Condition: Stable CODE STATUS: Full Diet recommendation: Regular  Brief/Interim Summary: KAYCEON OKI Sr.is an 79 y.o.Caucasian malewith medical history significant for hypertension, CAD, who was brought to the hospital because of nausea and diarrhea.  Found to be Covid positive on presentation and hypoxic  1/13: Clinically improving.  Ambulating around the room on my arrival.  Ambulating on room air and did not desaturate below 90% upon rest.  Communicative.  Improving.  1/14: Continue to clinically improved.  Ambulating on room.  Not requiring supplemental oxygen.  Mentating clearly.  Normal work of breathing.  Medically stable for discharge home.  Discharge Diagnoses:  Principal Problem:   Pneumonia due to COVID-19 virus Active Problems:   Hyponatremia  Acute hypoxemic respiratory failure 2/2 COVID-19 PNA --4 L/min oxygen via nasal cannulaon presentation. --Remains on 4 L of symptomatically improving --Completed IV remdesivir in house --Completed 5 days of dexamethasone, will prescribe additional doses as outpatient --Stable for discharge home --Isolation precautions given post discharge   Hypertensive urgency BP has improved.  Continue Coreg. Continue home regimen on discharge  Hyponatremia, improved --Likely due to volume depletion. Improved with gentle IV fluids. --Hold further IVF and encourage PO hydration. --Sodium improved on day of discharge  CAD --Continue aspirin  Discharge Instructions  Discharge Instructions    Diet - low sodium heart healthy   Complete by: As  directed    Increase activity slowly   Complete by: As directed    MyChart COVID-19 home monitoring program   Complete by: Sep 21, 2019    Is the patient willing to use the MyChart Mobile App for home monitoring?: Yes   Temperature monitoring   Complete by: Sep 21, 2019    After how many days would you like to receive a notification of this patient's flowsheet entries?: 1     Allergies as of 09/21/2019   No Known Allergies     Medication List    STOP taking these medications   predniSONE 20 MG tablet Commonly known as: DELTASONE     TAKE these medications   aspirin 81 MG chewable tablet Chew 81 mg by mouth daily.   carvedilol 6.25 MG tablet Commonly known as: COREG Take 6.25 mg by mouth daily.   dexamethasone 6 MG tablet Commonly known as: DECADRON Take 1 tablet (6 mg total) by mouth daily for 6 days. Start taking on: September 22, 2019   gabapentin 600 MG tablet Commonly known as: NEURONTIN Take 600 mg by mouth daily.            Durable Medical Equipment  (From admission, onward)         Start     Ordered   09/21/19 1128  For home use only DME Walker rolling  Once    Question Answer Comment  Walker: With 5 Inch Wheels   Patient needs a walker to treat with the following condition Weakness      09/21/19 1128          No Known Allergies  Consultations:  None   Procedures/Studies: DG Chest Portable 1 View  Result Date: 09/17/2019 CLINICAL DATA:  Cough EXAM: PORTABLE CHEST 1 VIEW COMPARISON:  None. FINDINGS: Mild cardiomegaly with calcific aortic atherosclerosis. Remote median sternotomy. Bilateral basilar predominant head air central opacities. No pleural effusion or pneumothorax. IMPRESSION: Bilateral basilar predominant air central opacities, which may indicate pulmonary edema or infection. Electronically Signed   By: Deatra Robinson M.D.   On: 09/17/2019 04:59    (Echo, Carotid, EGD, Colonoscopy, ERCP)    Subjective:   Discharge  Exam: Vitals:   09/21/19 0800 09/21/19 1331  BP: 114/65   Pulse:    Resp:    Temp: 98.2 F (36.8 C)   SpO2:  94%   Vitals:   09/20/19 1944 09/20/19 2307 09/21/19 0800 09/21/19 1331  BP: 121/64  114/65   Pulse:      Resp: 20     Temp: 97.9 F (36.6 C) 97.6 F (36.4 C) 98.2 F (36.8 C)   TempSrc: Oral Oral Oral   SpO2: 96%   94%  Weight:      Height:        General: Pt is alert, awake, not in acute distress Cardiovascular: RRR, S1/S2 +, no rubs, no gallops Respiratory: Mild scattered crackles bilaterally.  Normal work of breathing Abdominal: Soft, NT, ND, bowel sounds + Extremities: no edema, no cyanosis    The results of significant diagnostics from this hospitalization (including imaging, microbiology, ancillary and laboratory) are listed below for reference.     Microbiology: Recent Results (from the past 240 hour(s))  Respiratory Panel by RT PCR (Flu A&B, Covid) - Nasopharyngeal Swab     Status: Abnormal   Collection Time: 09/17/19  3:46 AM   Specimen: Nasopharyngeal Swab  Result Value Ref Range Status   SARS Coronavirus 2 by RT PCR POSITIVE (A) NEGATIVE Final    Comment: RESULT CALLED TO, READ BACK BY AND VERIFIED WITH: Shelby Dubin [facility] 09/17/19 AT 0454 HS    Influenza A by PCR NEGATIVE NEGATIVE Final   Influenza B by PCR NEGATIVE NEGATIVE Final    Comment: (NOTE) The Xpert Xpress SARS-CoV-2/FLU/RSV assay is intended as an aid in  the diagnosis of influenza from Nasopharyngeal swab specimens and  should not be used as a sole basis for treatment. Nasal washings and  aspirates are unacceptable for Xpert Xpress SARS-CoV-2/FLU/RSV  testing. Fact Sheet for Patients: https://www.moore.com/ Fact Sheet for Healthcare Providers: https://www.young.biz/ This test is not yet approved or cleared by the [place] FDA and  has been authorized for detection and/or diagnosis of SARS-CoV-2 by  FDA under an Emergency Use Authorization  (EUA). This EUA will remain  in effect (meaning this test can be used) for the duration of the  Covid-19 declaration under Section 564(b)(1) of the Act, 21  U.S.C. section 360bbb-3(b)(1), unless the authorization is  terminated or revoked. Performed at [facility], [address] [place], [place] [address]      Labs: BNP (last 3 results) No results for input(s): BNP in the last 8760 hours. Basic Metabolic Panel: Recent Labs  Lab 09/16/19 2219 09/18/19 0557 09/19/19 0342 09/20/19 0651 09/21/19 0739  NA 124* 129* 131* 133* 131*  K 3.9 3.9 3.7 4.0 4.2  CL 89* 99 98 98 98  CO2 23 20* 25 25 24   GLUCOSE 107* 100* 96 134* 128*  BUN 18 21 18  24* 25*  CREATININE 1.02 0.81 0.77 0.75 0.73  CALCIUM 8.4* 7.9* 8.1* 8.2* 8.2*  MG  --   --   --  2.2 2.0   Liver Function Tests: Recent Labs  Lab 09/16/19 2219 09/18/19 0557  AST 40  31  ALT 22 18  ALKPHOS 67 50  BILITOT 1.0 1.0  PROT 7.6 6.0*  ALBUMIN 3.7 2.9*   Recent Labs  Lab 09/16/19 2219  LIPASE 60*   No results for input(s): AMMONIA in the last 168 hours. CBC: Recent Labs  Lab 09/16/19 2219 09/18/19 0557 09/20/19 0651 09/21/19 0739  WBC 6.2 6.1 4.7 7.3  NEUTROABS 5.2 4.6  --   --   HGB 14.1 12.6* 12.6* 12.4*  HCT 39.6 36.1* 35.7* 34.6*  MCV 88.4 89.1 89.3 87.8  PLT 217 206 228 266   Cardiac Enzymes: No results for input(s): CKTOTAL, CKMB, CKMBINDEX, TROPONINI in the last 168 hours. BNP: Invalid input(s): POCBNP CBG: No results for input(s): GLUCAP in the last 168 hours. D-Dimer No results for input(s): DDIMER in the last 72 hours. Hgb A1c No results for input(s): HGBA1C in the last 72 hours. Lipid Profile No results for input(s): CHOL, HDL, LDLCALC, TRIG, CHOLHDL, LDLDIRECT in the last 72 hours. Thyroid function studies No results for input(s): TSH, T4TOTAL, T3FREE, THYROIDAB in the last 72 hours.  Invalid input(s): FREET3 Anemia work up Recent Labs    09/20/19 1130 09/21/19 0739   FERRITIN 555* 478*   Urinalysis No results found for: COLORURINE, APPEARANCEUR, LABSPEC, [place], GLUCOSEU, HGBUR, BILIRUBINUR, KETONESUR, PROTEINUR, UROBILINOGEN, NITRITE, LEUKOCYTESUR Sepsis Labs Invalid input(s): PROCALCITONIN,  WBC,  LACTICIDVEN Microbiology Recent Results (from the past 240 hour(s))  Respiratory Panel by RT PCR (Flu A&B, Covid) - Nasopharyngeal Swab     Status: Abnormal   Collection Time: 09/17/19  3:46 AM   Specimen: Nasopharyngeal Swab  Result Value Ref Range Status   SARS Coronavirus 2 by RT PCR POSITIVE (A) NEGATIVE Final    Comment: RESULT CALLED TO, READ BACK BY AND VERIFIED WITH: Annabelle Harman [facility] 09/17/19 AT 0454 HS    Influenza A by PCR NEGATIVE NEGATIVE Final   Influenza B by PCR NEGATIVE NEGATIVE Final    Comment: (NOTE) The Xpert Xpress SARS-CoV-2/FLU/RSV assay is intended as an aid in  the diagnosis of influenza from Nasopharyngeal swab specimens and  should not be used as a sole basis for treatment. Nasal washings and  aspirates are unacceptable for Xpert Xpress SARS-CoV-2/FLU/RSV  testing. Fact Sheet for Patients: PinkCheek.be Fact Sheet for Healthcare Providers: GravelBags.it This test is not yet approved or cleared by the [place] FDA and  has been authorized for detection and/or diagnosis of SARS-CoV-2 by  FDA under an Emergency Use Authorization (EUA). This EUA will remain  in effect (meaning this test can be used) for the duration of the  Covid-19 declaration under Section 564(b)(1) of the Act, 21  U.S.C. section 360bbb-3(b)(1), unless the authorization is  terminated or revoked. Performed at [facility], [address], [place], [place] [address]      Time coordinating discharge: Over 30 minutes  SIGNED:   Sidney Ace, MD  [facility] 09/21/2019, 3:16 PM Pager   If 7PM-7AM, please contact night-coverage [email or web address] Password [email or web address]

## 2019-10-02 ENCOUNTER — Other Ambulatory Visit: Payer: Self-pay

## 2020-12-21 IMAGING — DX DG CHEST 1V PORT
1 series · 1 of 1 positions shown · non-contrast
Comparison: None.

CLINICAL DATA: Cough

EXAM:
PORTABLE CHEST 1 VIEW

[chest ap]
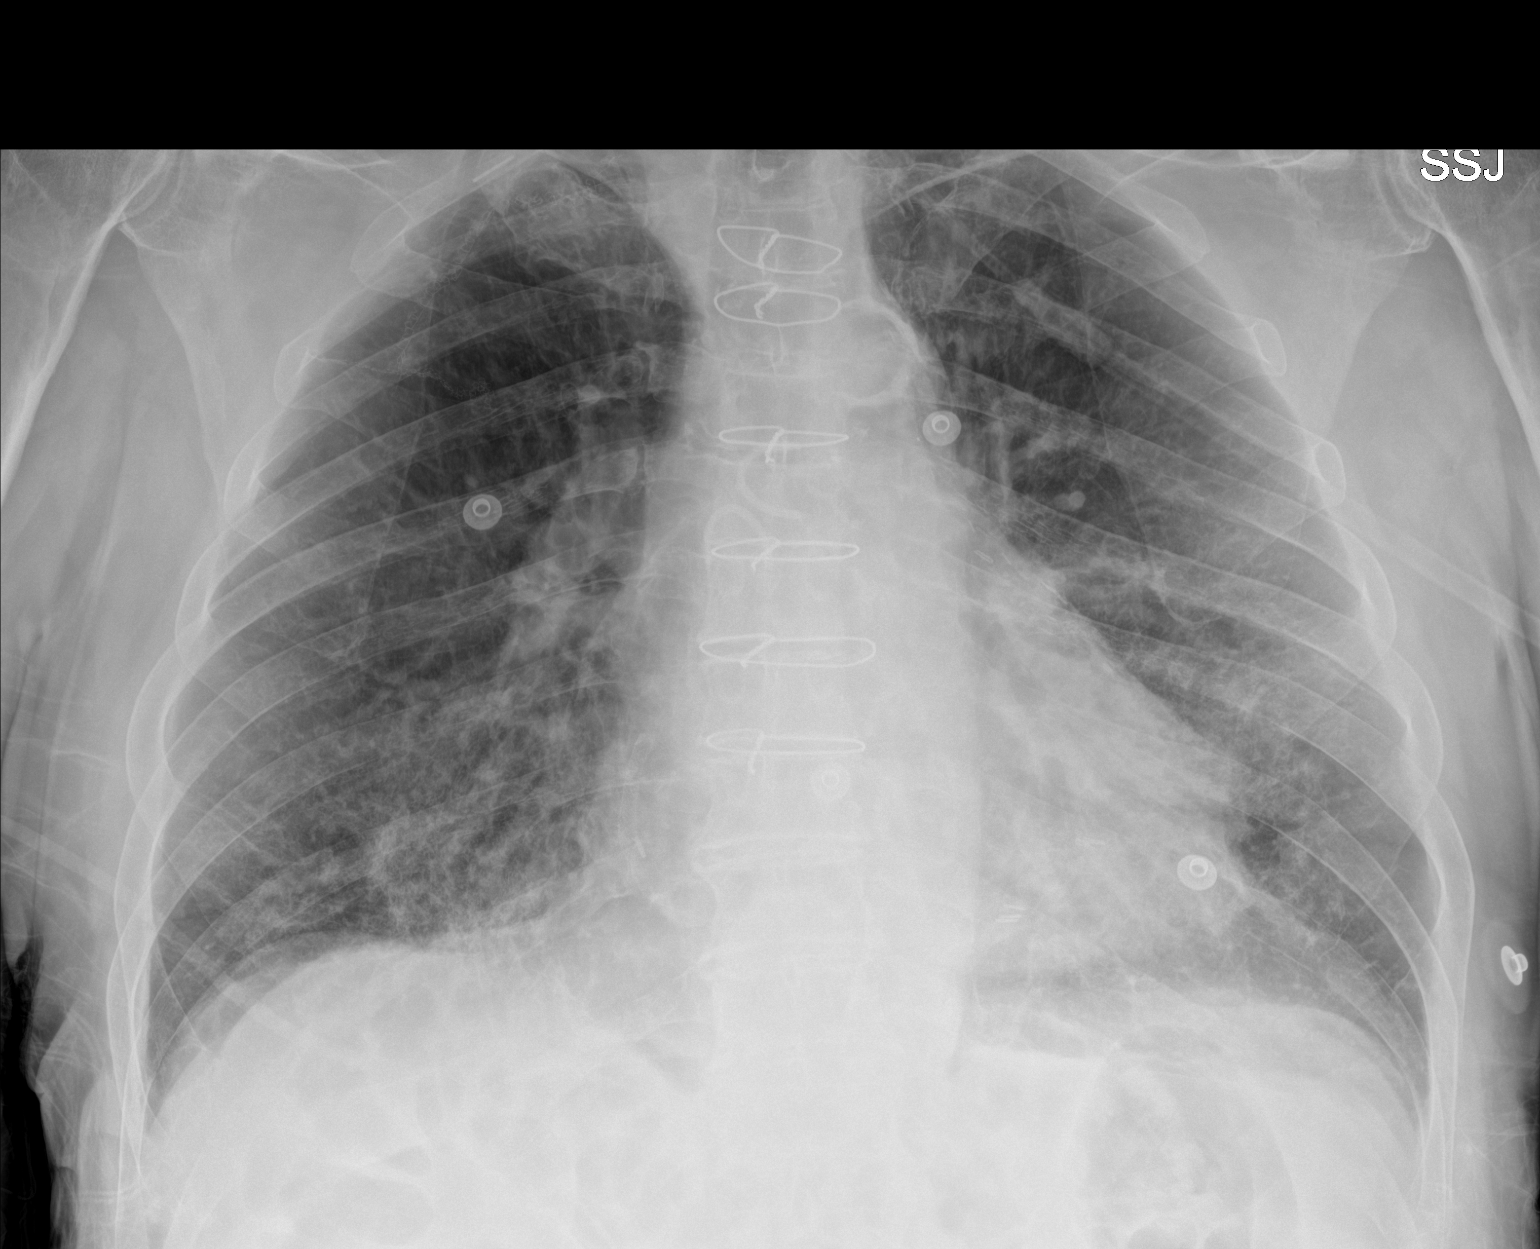

[1 of 1 positions shown; findings below may reference images not displayed]

FINDINGS: Mild cardiomegaly with calcific aortic atherosclerosis. Remote
median sternotomy. Bilateral basilar predominant head air central
opacities. No pleural effusion or pneumothorax.
IMPRESSION: Bilateral basilar predominant air central opacities, which may
indicate pulmonary edema or infection.

## 2021-04-26 ENCOUNTER — Other Ambulatory Visit: Payer: Self-pay

## 2021-04-26 ENCOUNTER — Encounter: Payer: Self-pay | Admitting: Emergency Medicine

## 2021-04-26 ENCOUNTER — Ambulatory Visit
Admission: EM | Admit: 2021-04-26 | Discharge: 2021-04-26 | Disposition: A | Discharge status: Home / Self Care | Payer: Medicare Other | Attending: Internal Medicine | Admitting: Internal Medicine

## 2021-04-26 DIAGNOSIS — R310 Gross hematuria: Secondary | ICD-10-CM | POA: Diagnosis not present

## 2021-04-26 LAB — POCT URINALYSIS DIP (DEVICE)
Bilirubin Urine: NEGATIVE
Glucose, UA: NEGATIVE mg/dL
Ketones, ur: NEGATIVE mg/dL
Leukocytes,Ua: NEGATIVE
Nitrite: NEGATIVE
Protein, ur: NEGATIVE mg/dL
Specific Gravity, Urine: 1.02 (ref 1.005–1.030)
Urobilinogen, UA: 0.2 mg/dL (ref 0.0–1.0)
pH: 7.5 (ref 5.0–8.0)

## 2021-04-26 NOTE — Discharge Instructions (Addendum)
Go to ER right now to have the bleeding looked into further testing.

## 2021-04-26 NOTE — ED Triage Notes (Signed)
Patient states that when he went to the bathroom and pulled his pants down he had blood on his underwear in the front.  Patient denies pain when urinating.  Patient states that his urine has been clear.

## 2021-04-26 NOTE — ED Notes (Signed)
Patient is being discharged from the Urgent Care and sent to the Emergency Department via POV . Per Starleen Blue PA, patient is in need of higher level of care due to gross hematuria. Patient is aware and verbalizes understanding of plan of care.  Vitals:   04/26/21 1500  BP: (!) 192/81  Pulse: 65  Resp: 16  Temp: 98.1 F (36.7 C)  SpO2: 96%

## 2021-04-26 NOTE — ED Provider Notes (Signed)
[facility]    CSN: [id number] Arrival date & time: 04/26/21  1447      History   Chief Complaint Chief Complaint  Patient presents with   Hematuria    HPI Bobby BUNNER Sr. is a 80 y.o. male. who presents with bleeding from his penis and saw blood all over his clothes before he voided. Since then he is dripping blood from his penis. Has done this 10 years ago, and had negative cystoscopy and never wants to have this done again. Denies dysuria or back pain. Has been a smoker in the past and quit 13 years ago. Denies flank pain or past hx of renal stones. He has been wearing depends and rolled tissue over his penis, and this am this was soaked.    Past Medical History:  Diagnosis Date   Coronary artery disease    Hypertension     Patient Active Problem List   Diagnosis Date Noted   Pneumonia due to COVID-19 virus 09/17/2019   Hyponatremia 09/17/2019    Past Surgical History:  Procedure Laterality Date   CARDIAC SURGERY     HERNIA REPAIR         Home Medications    Prior to Admission medications   Medication Sig Start Date End Date Taking? Authorizing Provider  amLODipine (NORVASC) 10 MG tablet TAKE ONE-HALF TABLET BY MOUTH EVERY DAY FOR BLOOD PRESSURE 11/11/20  Yes [provider]  aspirin 81 MG chewable tablet Chew 81 mg by mouth daily.    Yes [provider]  atorvastatin (LIPITOR) 80 MG tablet TAKE ONE TABLET BY MOUTH AT BEDTIME FOR CHOLESTEROL 05/16/20  Yes [provider]  carvedilol (COREG) 6.25 MG tablet Take 6.25 mg by mouth daily.   Yes [provider]  latanoprost (XALATAN) 0.005 % ophthalmic solution INSTILL 1 DROP IN EACH EYE AT BEDTIME FOR GLAUCOMA (STORE BOTTLES YOU ARE NOT USING IN THE REFRIGERATOR) 04/04/21  Yes [provider]  lisinopril (ZESTRIL) 40 MG tablet Take 1 tablet by mouth daily. 05/01/20  Yes [provider]  timolol (TIMOPTIC) 0.5 % ophthalmic solution INSTILL 1 DROP IN  [facility] EYE ONCE EVERY DAY 04/04/21  Yes [provider]  gabapentin (NEURONTIN) 600 MG tablet Take 600 mg by mouth daily.    [provider]    Family History History reviewed. No pertinent family history.  Social History Social History   Tobacco Use   Smoking status: Former   Smokeless tobacco: Never  Building services engineer Use: Never used  Substance Use Topics   Alcohol use: No   Drug use: No     Allergies   Patient has no known allergies.   Review of Systems Review of Systems  Genitourinary:  Positive for hematuria. Negative for difficulty urinating, dysuria, flank pain, frequency and urgency.  Musculoskeletal:  Negative for back pain and gait problem.    Physical Exam Triage Vital Signs ED Triage Vitals  Enc Vitals Group     BP 04/26/21 1500 (!) 192/81     Pulse Rate 04/26/21 1500 65     Resp 04/26/21 1500 16     Temp 04/26/21 1500 98.1 F (36.7 C)     Temp Source 04/26/21 1500 Temporal     SpO2 04/26/21 1500 96 %     Weight 04/26/21 1457 165 lb (74.8 kg)     Height 04/26/21 1457 5\' 8"  (1.727 m)     Head Circumference --      Peak  Flow --      Pain Score 04/26/21 1457 0     Pain Loc --      Pain Edu? --      Excl. in GC? --    No data found.  Updated Vital Signs BP (!) 192/81 (BP Location: Left Arm)   Pulse 65   Temp 98.1 F (36.7 C) (Temporal) Comment: patient drinking cold beverage at time of triage  Resp 16   Ht 5\' 8"  (1.727 m)   Wt 165 lb (74.8 kg)   SpO2 96%   BMI 25.09 kg/m   Visual Acuity Right Eye Distance:   Left Eye Distance:   Bilateral Distance:    Right Eye Near:   Left Eye Near:    Bilateral Near:     Physical Exam Genitourinary:    Comments: Has bright blood coming from the meatus  Physical Exam Vitals and nursing note reviewed.  Constitutional:      General: She is not in acute distress.    Appearance: She is not toxic-appearing.  HENT:     Head: Normocephalic.     Right Ear: External ear normal.      Left Ear: External ear normal.  Eyes:     General: No scleral icterus.    Conjunctiva/sclera: Conjunctivae normal.  Pulmonary:     Effort: Pulmonary effort is normal.  Abdominal:     General: Bowel sounds are normal.     Palpations: Abdomen is soft. There is no mass.     Tenderness: There is no guarding or rebound.     Comments: - CVA tenderness   Musculoskeletal:        General: Normal range of motion.     Cervical back: Neck supple.    Skin:    General: Skin is warm and dry.     Findings: No rash.  Neurological:     Mental Status: She is alert and oriented to person, place, and time.     Gait: Gait normal.  Psychiatric:        Mood and Affect: Mood normal.        Behavior: Behavior normal.        Thought Content: Thought content normal.        Judgment: Judgment normal.    UC Treatments / Results  Labs (all labs ordered are listed, but only abnormal results are displayed) Labs Reviewed  POCT URINALYSIS DIP (DEVICE) - Abnormal; Notable for the following components:      Result Value   Hgb urine dipstick MODERATE (*)    All other components within normal limits  URINE CULTURE  POCT URINALYSIS DIPSTICK, ED / UC    EKG   Radiology No results found.  Procedures Procedures (including critical care time)  Medications Ordered in UC Medications - No data to display  Initial Impression / Assessment and Plan / UC Course  I have reviewed the triage vital signs and the nursing notes. Pertinent labs results that were available during my care of the patient were reviewed by me and considered in my medical decision making (see chart for details). Has painless hematuria. I sent him to ER for further work up today.     Final Clinical Impressions(s) / UC Diagnoses   Final diagnoses:  None   Discharge Instructions   None    ED Prescriptions   None    PDMP not reviewed this encounter.   , Garey Ham 04/26/21 [phone]

## 2021-04-28 LAB — URINE CULTURE: Culture: NO GROWTH

## 2021-11-22 ENCOUNTER — Encounter: Payer: Self-pay | Admitting: Emergency Medicine

## 2021-11-22 ENCOUNTER — Other Ambulatory Visit: Payer: Self-pay

## 2021-11-22 ENCOUNTER — Ambulatory Visit
Admission: EM | Admit: 2021-11-22 | Discharge: 2021-11-22 | Disposition: A | Discharge status: Home / Self Care | Payer: Medicare Other | Attending: Internal Medicine | Admitting: Internal Medicine

## 2021-11-22 DIAGNOSIS — K1120 Sialoadenitis, unspecified: Secondary | ICD-10-CM | POA: Diagnosis not present

## 2021-11-22 MED ORDER — AMOXICILLIN-POT CLAVULANATE 875-125 MG PO TABS: 1.0000 | ORAL_TABLET | Freq: Two times a day (BID) | ORAL | 0 refills | Status: AC

## 2021-11-22 NOTE — Discharge Instructions (Signed)
Suck on lemon drops 3-4 times a day to help open the saliva gland ?If the swelling is not improved after 4 days of antibiotics, please go see your primary care doctor or ear nose and throat doctor.  ?

## 2021-11-22 NOTE — ED Triage Notes (Signed)
PT has swollen gland in left neck below jaw. Swelling has waxed and waned for 4 days. At certain times, this area is painful to touch. He has had a similar issue in the past and was told it was a salivary gland.  ?

## 2021-11-22 NOTE — ED Provider Notes (Signed)
?[facility] ? ? ? ?CSN: [id number] ?Arrival date & time: 11/22/21  1506 ? ? ?  ? ?History   ?Chief Complaint ?Chief Complaint  ?Patient presents with  ? Lymphadenopathy  ? ? ?HPI ?Bobby Strong Sr. is a 81 y.o. male who presents with intermittent swelling of L neck gland. Was larger earlier, and is smaller now. Cant tell if related to eating. Is a little sore. Has something similar years ago, and was salivary gland infection. He denies having ST, URI or fever. He does not chew tobacco.  ? ? ? ?Past Medical History:  ?Diagnosis Date  ? Coronary artery disease   ? Hypertension   ? ? ?Patient Active Problem List  ? Diagnosis Date Noted  ? Pneumonia due to COVID-19 virus 09/17/2019  ? Hyponatremia 09/17/2019  ? ? ?Past Surgical History:  ?Procedure Laterality Date  ? CARDIAC SURGERY    ? HERNIA REPAIR    ? ? ? ? ? ?Home Medications   ? ?Prior to Admission medications   ?Medication Sig Start Date End Date Taking? Authorizing Provider  ?amLODipine (NORVASC) 10 MG tablet TAKE ONE-HALF TABLET BY MOUTH EVERY DAY FOR BLOOD PRESSURE 11/11/20   [provider]  ?aspirin 81 MG chewable tablet Chew 81 mg by mouth daily.     [provider]  ?atorvastatin (LIPITOR) 80 MG tablet TAKE ONE TABLET BY MOUTH AT BEDTIME FOR CHOLESTEROL 05/16/20   [provider]  ?carvedilol (COREG) 6.25 MG tablet Take 6.25 mg by mouth daily.    [provider]  ?gabapentin (NEURONTIN) 600 MG tablet Take 600 mg by mouth daily.    [provider]  ?latanoprost (XALATAN) 0.005 % ophthalmic solution INSTILL 1 DROP IN EACH EYE AT BEDTIME FOR GLAUCOMA (STORE BOTTLES YOU ARE NOT USING IN THE REFRIGERATOR) 04/04/21   [provider]  ?lisinopril (ZESTRIL) 40 MG tablet Take 1 tablet by mouth daily. 05/01/20   [provider]  ?timolol (TIMOPTIC) 0.5 % ophthalmic solution INSTILL 1 DROP IN [facility] EYE ONCE EVERY DAY 04/04/21   [provider]  ? ? ?Family History ?No family history on  file. ? ?Social History ?Social History  ? ?Tobacco Use  ? Smoking status: Former  ? Smokeless tobacco: Never  ?Vaping Use  ? Vaping Use: Never used  ?Substance Use Topics  ? Alcohol use: No  ? Drug use: No  ? ? ? ?Allergies   ?Patient has no known allergies. ? ? ?Review of Systems ?Review of Systems  ?Constitutional:  Negative for chills and fever.  ?HENT:  Negative for congestion, dental problem, mouth sores, sore throat and trouble swallowing.   ?Respiratory:  Negative for cough.   ?Musculoskeletal:  Negative for neck pain and neck stiffness.  ?Skin:  Negative for rash.  ?Hematological:  Positive for adenopathy.  ? ? ?Physical Exam ?Triage Vital Signs ?ED Triage Vitals [11/22/21 1535]  ?Enc Vitals Group  ?   BP (!) 159/64  ?   Pulse Rate 62  ?   Resp 16  ?   Temp 97.9 ?F (36.6 ?C)  ?   Temp Source Oral  ?   SpO2 100 %  ?   Weight   ?   Height   ?   Head Circumference   ?   Peak Flow   ?   Pain Score   ?   Pain Loc   ?   Pain Edu?   ?   Excl. in GC?   ? ?  No data found. ? ?Updated Vital Signs ?BP (!) 159/64   Pulse 62   Temp 97.9 ?F (36.6 ?C) (Oral)   Resp 16   SpO2 100%  ? ?Visual Acuity ?Right Eye Distance:   ?Left Eye Distance:   ?Bilateral Distance:   ? ?Right Eye Near:   ?Left Eye Near:    ?Bilateral Near:    ? ?Physical Exam ?Vitals reviewed.  ?Constitutional:   ?   General: He is not in acute distress. ?   Appearance: He is normal weight. He is not toxic-appearing.  ?HENT:  ?   Head:  ?   Jaw: No tenderness.  ?   Salivary Glands: Left salivary gland is diffusely enlarged and tender.  ?   Mouth/Throat:  ?   Mouth: Mucous membranes are moist. No oral lesions.  ?   Dentition: No dental tenderness or gum lesions.  ?   Tongue: No lesions.  ?   Pharynx: Oropharynx is clear. No oropharyngeal exudate or posterior oropharyngeal erythema.  ?Neck:  ?   Comments: Has a mobile, minimally tender mass on upper L cervical chain, right below the submandibular region.  ?Musculoskeletal:  ?   Cervical back: Neck supple.   ?Lymphadenopathy:  ?   Cervical: Cervical adenopathy present.  ?Skin: ?   General: Skin is warm and dry.  ?   Findings: No erythema.  ?Neurological:  ?   Mental Status: He is alert.  ? ? ? ?UC Treatments / Results  ?Labs ?(all labs ordered are listed, but only abnormal results are displayed) ?Labs Reviewed  ?GROUP A STREP BY PCR  ? ? ?EKG ? ? ?Radiology ?No results found. ? ?Procedures ?Procedures (including critical care time) ? ?Medications Ordered in UC ?Medications - No data to display ? ?Initial Impression / Assessment and Plan / UC Course  ?I have reviewed the triage vital signs and the nursing notes. ?Possibly salivary gland infection ?I placed him on Augmentin and advised to suck on lemon drops.  ?If not improved or worse in 4 days, needs to see PCP or ENT.  ?Final Clinical Impressions(s) / UC Diagnoses  ? ?Final diagnoses:  ?None  ? ?Discharge Instructions   ?None ?  ? ?ED Prescriptions   ?None ?  ? ?PDMP not reviewed this encounter. ?  ?Garey Ham, PA-C ?11/22/21 1606 ? ?

## 2021-12-25 DIAGNOSIS — K1121 Acute sialoadenitis: Secondary | ICD-10-CM | POA: Diagnosis not present

## 2021-12-25 DIAGNOSIS — K115 Sialolithiasis: Secondary | ICD-10-CM | POA: Diagnosis not present
